# Patient Record
Sex: Female | Born: 1955 | Race: White | Hispanic: No | Marital: Single | Smoking: Former smoker
Health system: Southern US, Community
[De-identification: ages and names within clinical notes are randomized; demographics above are authoritative.]

## PROBLEM LIST (undated history)

## (undated) DIAGNOSIS — E119 Type 2 diabetes mellitus without complications: Secondary | ICD-10-CM

## (undated) DIAGNOSIS — K648 Other hemorrhoids: Secondary | ICD-10-CM

## (undated) DIAGNOSIS — I77810 Thoracic aortic ectasia: Secondary | ICD-10-CM

## (undated) DIAGNOSIS — K2270 Barrett's esophagus without dysplasia: Secondary | ICD-10-CM

## (undated) DIAGNOSIS — F32A Depression, unspecified: Secondary | ICD-10-CM

## (undated) DIAGNOSIS — G8929 Other chronic pain: Secondary | ICD-10-CM

## (undated) DIAGNOSIS — I2510 Atherosclerotic heart disease of native coronary artery without angina pectoris: Secondary | ICD-10-CM

## (undated) DIAGNOSIS — F419 Anxiety disorder, unspecified: Secondary | ICD-10-CM

## (undated) DIAGNOSIS — K219 Gastro-esophageal reflux disease without esophagitis: Secondary | ICD-10-CM

## (undated) DIAGNOSIS — M549 Dorsalgia, unspecified: Secondary | ICD-10-CM

## (undated) DIAGNOSIS — J45909 Unspecified asthma, uncomplicated: Secondary | ICD-10-CM

## (undated) DIAGNOSIS — F329 Major depressive disorder, single episode, unspecified: Secondary | ICD-10-CM

## (undated) DIAGNOSIS — G4730 Sleep apnea, unspecified: Secondary | ICD-10-CM

## (undated) DIAGNOSIS — I10 Essential (primary) hypertension: Secondary | ICD-10-CM

## (undated) DIAGNOSIS — M4800 Spinal stenosis, site unspecified: Secondary | ICD-10-CM

## (undated) DIAGNOSIS — G43909 Migraine, unspecified, not intractable, without status migrainosus: Secondary | ICD-10-CM

## (undated) HISTORY — DX: Sleep apnea, unspecified: G47.30

## (undated) HISTORY — PX: CARPAL TUNNEL RELEASE: SHX101

## (undated) HISTORY — DX: Barrett's esophagus without dysplasia: K22.70

## (undated) HISTORY — DX: Other chronic pain: G89.29

## (undated) HISTORY — DX: Atherosclerotic heart disease of native coronary artery without angina pectoris: I25.10

## (undated) HISTORY — DX: Dorsalgia, unspecified: M54.9

## (undated) HISTORY — DX: Gastro-esophageal reflux disease without esophagitis: K21.9

## (undated) HISTORY — DX: Thoracic aortic ectasia: I77.810

## (undated) HISTORY — DX: Migraine, unspecified, not intractable, without status migrainosus: G43.909

## (undated) HISTORY — DX: Type 2 diabetes mellitus without complications: E11.9

## (undated) HISTORY — DX: Anxiety disorder, unspecified: F41.9

## (undated) HISTORY — DX: Major depressive disorder, single episode, unspecified: F32.9

## (undated) HISTORY — DX: Essential (primary) hypertension: I10

## (undated) HISTORY — DX: Unspecified asthma, uncomplicated: J45.909

## (undated) HISTORY — DX: Depression, unspecified: F32.A

## (undated) HISTORY — PX: CHOLECYSTECTOMY: SHX55

## (undated) HISTORY — PX: ULNAR NERVE REPAIR: SHX2594

## (undated) HISTORY — DX: Spinal stenosis, site unspecified: M48.00

## (undated) HISTORY — DX: Other hemorrhoids: K64.8

---

## 2012-11-05 HISTORY — PX: DILATION AND CURETTAGE OF UTERUS: SHX78

## 2013-06-01 ENCOUNTER — Encounter: Payer: Self-pay | Admitting: *Deleted

## 2013-06-01 LAB — HM MAMMOGRAPHY

## 2013-06-17 ENCOUNTER — Ambulatory Visit (INDEPENDENT_AMBULATORY_CARE_PROVIDER_SITE_OTHER): Payer: Medicare Other | Admitting: Family Medicine

## 2013-06-17 ENCOUNTER — Encounter (INDEPENDENT_AMBULATORY_CARE_PROVIDER_SITE_OTHER): Payer: Self-pay

## 2013-06-17 ENCOUNTER — Encounter: Payer: Self-pay | Admitting: Family Medicine

## 2013-06-17 VITALS — BP 119/74 | HR 72 | Resp 16 | Ht 65.0 in | Wt 286.0 lb

## 2013-06-17 DIAGNOSIS — E559 Vitamin D deficiency, unspecified: Secondary | ICD-10-CM

## 2013-06-17 DIAGNOSIS — R5383 Other fatigue: Secondary | ICD-10-CM

## 2013-06-17 DIAGNOSIS — I10 Essential (primary) hypertension: Secondary | ICD-10-CM

## 2013-06-17 DIAGNOSIS — G43909 Migraine, unspecified, not intractable, without status migrainosus: Secondary | ICD-10-CM

## 2013-06-17 DIAGNOSIS — E785 Hyperlipidemia, unspecified: Secondary | ICD-10-CM

## 2013-06-17 DIAGNOSIS — M25562 Pain in left knee: Secondary | ICD-10-CM

## 2013-06-17 DIAGNOSIS — M25561 Pain in right knee: Secondary | ICD-10-CM

## 2013-06-17 DIAGNOSIS — M25569 Pain in unspecified knee: Secondary | ICD-10-CM

## 2013-06-17 DIAGNOSIS — R5381 Other malaise: Secondary | ICD-10-CM

## 2013-06-17 DIAGNOSIS — E119 Type 2 diabetes mellitus without complications: Secondary | ICD-10-CM

## 2013-06-17 DIAGNOSIS — M7989 Other specified soft tissue disorders: Secondary | ICD-10-CM

## 2013-06-17 DIAGNOSIS — F411 Generalized anxiety disorder: Secondary | ICD-10-CM

## 2013-06-17 DIAGNOSIS — K219 Gastro-esophageal reflux disease without esophagitis: Secondary | ICD-10-CM

## 2013-06-17 LAB — POCT GLYCOSYLATED HEMOGLOBIN (HGB A1C): Hemoglobin A1C: 6.3

## 2013-06-17 MED ORDER — DICLOFENAC SODIUM 1 % TD GEL: 4.0000 g | Freq: Four times a day (QID) | TRANSDERMAL | Status: AC

## 2013-06-17 MED ORDER — POTASSIUM CHLORIDE CRYS ER 20 MEQ PO TBCR: 20.0000 meq | EXTENDED_RELEASE_TABLET | Freq: Two times a day (BID) | ORAL | Status: AC

## 2013-06-17 MED ORDER — FUROSEMIDE 80 MG PO TABS: ORAL_TABLET | ORAL | Status: DC

## 2013-06-17 MED ORDER — ESOMEPRAZOLE MAGNESIUM 40 MG PO CPDR: 40.0000 mg | DELAYED_RELEASE_CAPSULE | Freq: Two times a day (BID) | ORAL | Status: DC

## 2013-06-17 MED ORDER — IRBESARTAN 150 MG PO TABS: 150.0000 mg | ORAL_TABLET | Freq: Every day | ORAL | Status: DC

## 2013-06-17 MED ORDER — METFORMIN HCL ER 500 MG PO TB24: 500.0000 mg | ORAL_TABLET | Freq: Two times a day (BID) | ORAL | Status: DC

## 2013-06-17 NOTE — Progress Notes (Signed)
Subjective:    Patient ID: Martha Nolan, female    DOB: 03/07/1955, 58 y.o.   MRN: [id number]  HPI  Martha Nolan is here today to re-establish care with our practice.  She needs medication refills for the following  conditions:    1) IFG  She is currently checking her sugars at home with a meter.  Her readings have ranged from 114 to 149.  Her A1c today is 6.3.   2) Hypertension - She is currently taking Atenolol and HCTZ.   She feels this combination is working well for her and would like refills.   3) Leg Swelling - She is currently taking Furosemide and Potassium Chloride.  She feels this is a good combination and is doing well.  She needs a refill.  4) Mood - She is currently taking Abilify and Cymbalta.  She feels its working and would like a refill.  5) Shortness of breath - She is currently using Proair HFA as needed.  She was tested for COPD.  She was told it was negative.    Review of Systems  Constitutional: Negative for activity change, appetite change and fatigue.  Respiratory: Positive for apnea, shortness of breath and wheezing.   Cardiovascular: Positive for leg swelling. Negative for chest pain and palpitations.  Psychiatric/Behavioral: Positive for sleep disturbance (sleep apnea). Negative for behavioral problems. The patient is not nervous/anxious.   All other systems reviewed and are negative.   Past Medical History  Diagnosis Date  . Migraine   . GERD (gastroesophageal reflux disease)   . Hypertension   . Internal hemorrhoids   . Diabetes mellitus without complication     not on any medications  . Anxiety   . Depression   . Barrett's esophagus     Dr. Nedra HaiLee (EGD - 03/2013) Q 3 years   . Asthma   . Chronic pain     Low Back L4-S1  . Sleep apnea      Past Surgical History  Procedure Laterality Date  . Cholecystectomy    . Cesarean section    . Carpal tunnel release    . Ulnar nerve repair       History   Social History Narrative   Marital Status:  She has a Media plannerDomestic Partner Tax inspector(Paige Green)   Children:  Daughter Vernona Rieger(Laura)    Pets: Dogs (2), Cats (2)     Living Situation:  Lives with domestic partner and children.   Occupation:  Disabled     Education:  [employer]    Tobacco Use/Exposure:  She quit smoking in 1/15.     Alcohol Use:  No   Drug Use: No   Diet: Healthy   Exercise: Limited    Hobbies:  Reading                 Family History  Problem Relation Age of Onset  . Hypertension Mother   . Hypertension Father   . Hypertension Maternal Grandmother   . Diabetes Maternal Grandmother   . Hypertension Maternal Grandfather   . Cervical cancer Paternal Grandmother   . Hypertension Paternal Grandmother   . Hypertension Paternal Grandfather   . Diabetes Paternal Uncle       Allergies  Allergen Reactions  . Penicillins Hives       Objective:   Physical Exam  Nursing note and vitals reviewed. Constitutional: She is oriented to person, place, and time. She appears well-developed.  HENT:  Head: Normocephalic.  Eyes: Pupils are equal,  round, and reactive to light.  Cardiovascular: Normal rate.   Pulmonary/Chest: Effort normal.  Neurological: She is alert and oriented to person, place, and time.  Skin: Skin is warm and dry.  Psychiatric: She has a normal mood and affect. Her behavior is normal. Judgment and thought content normal.       Assessment & Plan:    Martha Nolan was seen today for medication refills.  Diagnoses and associated orders for this visit:  Essential hypertension, benign - irbesartan (AVAPRO) 150 MG tablet; Take 1 tablet (150 mg total) by mouth daily. - COMPLETE METABOLIC PANEL WITH GFR  Migraine, unspecified, without mention of intractable migraine without mention of status migrainosus  Anxiety state, unspecified  Swelling of limb - potassium chloride SA (K-DUR,KLOR-CON) 20 MEQ tablet; Take 1 tablet (20 mEq total) by mouth 2 (two) times daily. - furosemide (LASIX) 80 MG tablet;  Take 1/2 - 1 tab daily for fluid  Type II or unspecified type diabetes mellitus without mention of complication, not stated as uncontrolled - POCT HgB A1C - metFORMIN (GLUCOPHAGE-XR) 500 MG 24 hr tablet; Take 1 tablet (500 mg total) by mouth 2 (two) times daily.  Knee pain, bilateral - diclofenac sodium (VOLTAREN) 1 % GEL; Apply 4 g topically 4 (four) times daily.  Other malaise and fatigue - CBC w/Diff - TSH  Other and unspecified hyperlipidemia - Lipid panel  Unspecified vitamin D deficiency - Vit D  25 hydroxy (rtn osteoporosis monitoring)  Gastroesophageal reflux disease without esophagitis - esomeprazole (NEXIUM) 40 MG capsule; Take 1 capsule (40 mg total) by mouth 2 (two) times daily before a meal.   TIME SPENT "FACE TO FACE" WITH PATIENT -  45 MINS

## 2013-06-19 LAB — CBC WITH DIFFERENTIAL/PLATELET
Basophils Absolute: 0.1 10*3/uL (ref 0.0–0.1)
Basophils Relative: 1 % (ref 0–1)
Eosinophils Absolute: 0.1 10*3/uL (ref 0.0–0.7)
Eosinophils Relative: 2 % (ref 0–5)
HCT: 35.8 % — ABNORMAL LOW (ref 36.0–46.0)
Hemoglobin: 12.4 g/dL (ref 12.0–15.0)
Lymphocytes Relative: 28 % (ref 12–46)
Lymphs Abs: 1.9 10*3/uL (ref 0.7–4.0)
MCH: 29.8 pg (ref 26.0–34.0)
MCHC: 34.6 g/dL (ref 30.0–36.0)
MCV: 86.1 fL (ref 78.0–100.0)
Monocytes Absolute: 0.4 10*3/uL (ref 0.1–1.0)
Monocytes Relative: 6 % (ref 3–12)
Neutro Abs: 4.2 10*3/uL (ref 1.7–7.7)
Neutrophils Relative %: 63 % (ref 43–77)
Platelets: 322 10*3/uL (ref 150–400)
RBC: 4.16 MIL/uL (ref 3.87–5.11)
RDW: 14.8 % (ref 11.5–15.5)
WBC: 6.7 10*3/uL (ref 4.0–10.5)

## 2013-06-19 LAB — COMPLETE METABOLIC PANEL WITH GFR
ALT: 23 U/L (ref 0–35)
AST: 28 U/L (ref 0–37)
Albumin: 4 g/dL (ref 3.5–5.2)
Alkaline Phosphatase: 67 U/L (ref 39–117)
BUN: 15 mg/dL (ref 6–23)
CO2: 30 mEq/L (ref 19–32)
Calcium: 9.7 mg/dL (ref 8.4–10.5)
Chloride: 95 mEq/L — ABNORMAL LOW (ref 96–112)
Creat: 1.01 mg/dL (ref 0.50–1.10)
GFR, Est African American: 72 mL/min
GFR, Est Non African American: 62 mL/min
Glucose, Bld: 113 mg/dL — ABNORMAL HIGH (ref 70–99)
Potassium: 3.5 mEq/L (ref 3.5–5.3)
Sodium: 138 mEq/L (ref 135–145)
Total Bilirubin: 0.7 mg/dL (ref 0.2–1.2)
Total Protein: 6.9 g/dL (ref 6.0–8.3)

## 2013-06-19 LAB — LIPID PANEL
Cholesterol: 186 mg/dL (ref 0–200)
HDL: 38 mg/dL — ABNORMAL LOW (ref >39)
LDL Cholesterol: 102 mg/dL — ABNORMAL HIGH (ref 0–99)
Total CHOL/HDL Ratio: 4.9 Ratio
Triglycerides: 229 mg/dL — ABNORMAL HIGH (ref <150)
VLDL: 46 mg/dL — ABNORMAL HIGH (ref 0–40)

## 2013-06-20 LAB — TSH: TSH: 0.919 u[IU]/mL (ref 0.350–4.500)

## 2013-06-20 LAB — VITAMIN D 25 HYDROXY (VIT D DEFICIENCY, FRACTURES): Vit D, 25-Hydroxy: 37 ng/mL (ref 30–89)

## 2013-07-26 DIAGNOSIS — M7989 Other specified soft tissue disorders: Secondary | ICD-10-CM | POA: Insufficient documentation

## 2013-07-26 DIAGNOSIS — M25562 Pain in left knee: Secondary | ICD-10-CM | POA: Insufficient documentation

## 2013-07-26 DIAGNOSIS — E119 Type 2 diabetes mellitus without complications: Secondary | ICD-10-CM | POA: Insufficient documentation

## 2013-07-26 DIAGNOSIS — E785 Hyperlipidemia, unspecified: Secondary | ICD-10-CM | POA: Insufficient documentation

## 2013-07-26 DIAGNOSIS — R5381 Other malaise: Secondary | ICD-10-CM | POA: Insufficient documentation

## 2013-07-26 DIAGNOSIS — E559 Vitamin D deficiency, unspecified: Secondary | ICD-10-CM | POA: Insufficient documentation

## 2013-07-26 DIAGNOSIS — F411 Generalized anxiety disorder: Secondary | ICD-10-CM | POA: Insufficient documentation

## 2013-07-26 DIAGNOSIS — R5383 Other fatigue: Secondary | ICD-10-CM

## 2013-07-26 DIAGNOSIS — R7301 Impaired fasting glucose: Secondary | ICD-10-CM | POA: Insufficient documentation

## 2013-07-26 DIAGNOSIS — M25561 Pain in right knee: Secondary | ICD-10-CM | POA: Insufficient documentation

## 2013-07-27 ENCOUNTER — Encounter: Payer: Self-pay | Admitting: Family Medicine

## 2013-07-27 ENCOUNTER — Ambulatory Visit (INDEPENDENT_AMBULATORY_CARE_PROVIDER_SITE_OTHER): Payer: Medicare Other | Admitting: Family Medicine

## 2013-07-27 VITALS — BP 128/79 | HR 84 | Resp 16 | Ht 65.5 in | Wt 236.0 lb

## 2013-07-27 DIAGNOSIS — I10 Essential (primary) hypertension: Secondary | ICD-10-CM

## 2013-07-27 DIAGNOSIS — Z0000 Encounter for general adult medical examination without abnormal findings: Secondary | ICD-10-CM

## 2013-07-27 DIAGNOSIS — M25561 Pain in right knee: Secondary | ICD-10-CM

## 2013-07-27 DIAGNOSIS — M25569 Pain in unspecified knee: Secondary | ICD-10-CM

## 2013-07-27 DIAGNOSIS — B351 Tinea unguium: Secondary | ICD-10-CM

## 2013-07-27 DIAGNOSIS — E119 Type 2 diabetes mellitus without complications: Secondary | ICD-10-CM

## 2013-07-27 DIAGNOSIS — Z23 Encounter for immunization: Secondary | ICD-10-CM

## 2013-07-27 LAB — POCT URINALYSIS DIPSTICK
Bilirubin, UA: NEGATIVE
Blood, UA: NEGATIVE
Glucose, UA: NEGATIVE
Ketones, UA: NEGATIVE
Leukocytes, UA: NEGATIVE
Nitrite, UA: NEGATIVE
Protein, UA: NEGATIVE
Spec Grav, UA: 1.015
Urobilinogen, UA: NEGATIVE
pH, UA: 7.5

## 2013-07-27 LAB — POCT UA - MICROALBUMIN
Albumin/Creatinine Ratio, Urine, POC: 4.2
Creatinine, POC: 120.3 mg/dL
Microalbumin Ur, POC: 5 mg/L

## 2013-07-27 MED ORDER — TERBINAFINE HCL 250 MG PO TABS: 250.0000 mg | ORAL_TABLET | Freq: Every day | ORAL | Status: AC

## 2013-07-27 MED ORDER — ZOSTER VACCINE LIVE 19400 UNT/0.65ML ~~LOC~~ SOLR: 0.6500 mL | Freq: Once | SUBCUTANEOUS | Status: DC

## 2013-07-27 MED ORDER — TETANUS-DIPHTH-ACELL PERTUSSIS 5-2.5-18.5 LF-MCG/0.5 IM SUSP: 0.5000 mL | Freq: Once | INTRAMUSCULAR | Status: DC

## 2013-07-27 NOTE — Patient Instructions (Signed)
1)  Preventative Issues -   A)  Confirm the date of your last colonoscopy, if you have had polyps in th past and when they want to do your next one (5 vs 10 years).   B) Confirm the date you had the Pneumovax  C)  Consider getting the Zostavax (after 08/26/2013) and the Tdap.    D)  Eye Exam   E)  Start on a Baby ASA 81 mg daily   2)  Nail Fungus - Take one Lamisil daily.  3)  Knee - We're referring you to an orthopedist.     Preventive Care for Adults A healthy lifestyle and preventive care can promote health and wellness. Preventive health guidelines for women include the following key practices.  A routine yearly physical is a good way to check with your health care provider about your health and preventive screening. It is a chance to share any concerns and updates on your health and to receive a thorough exam.  Visit your dentist for a routine exam and preventive care every 6 months. Brush your teeth twice a day and floss once a day. Good oral hygiene prevents tooth decay and gum disease.  The frequency of eye exams is based on your age, health, family medical history, use of contact lenses, and other factors. Follow your health care provider's recommendations for frequency of eye exams.  Eat a healthy diet. Foods like vegetables, fruits, whole grains, low-fat dairy products, and lean protein foods contain the nutrients you need without too many calories. Decrease your intake of foods high in solid fats, added sugars, and salt. Eat the right amount of calories for you.Get information about a proper diet from your health care provider, if necessary.  Regular physical exercise is one of the most important things you can do for your health. Most adults should get at least 150 minutes of moderate-intensity exercise (any activity that increases your heart rate and causes you to sweat) each week. In addition, most adults need muscle-strengthening exercises on 2 or more days a  week.  Maintain a healthy weight. The body mass index (BMI) is a screening tool to identify possible weight problems. It provides an estimate of body fat based on height and weight. Your health care provider can find your BMI, and can help you achieve or maintain a healthy weight.For adults 20 years and older:  A BMI below 18.5 is considered underweight.  A BMI of 18.5 to 24.9 is normal.  A BMI of 25 to 29.9 is considered overweight.  A BMI of 30 and above is considered obese.  Maintain normal blood lipids and cholesterol levels by exercising and minimizing your intake of saturated fat. Eat a balanced diet with plenty of fruit and vegetables. Blood tests for lipids and cholesterol should begin at age 68 and be repeated every 5 years. If your lipid or cholesterol levels are high, you are over 50, or you are at high risk for heart disease, you may need your cholesterol levels checked more frequently.Ongoing high lipid and cholesterol levels should be treated with medicines if diet and exercise are not working.  If you smoke, find out from your health care provider how to quit. If you do not use tobacco, do not start.  Lung cancer screening is recommended for adults aged 49-80 years who are at high risk for developing lung cancer because of a history of smoking. A yearly low-dose CT scan of the lungs is recommended for people who have at  least a 30-pack-year history of smoking and are a current smoker or have quit within the past 15 years. A pack year of smoking is smoking an average of 1 pack of cigarettes a day for 1 year (for example: 1 pack a day for 30 years or 2 packs a day for 15 years). Yearly screening should continue until the smoker has stopped smoking for at least 15 years. Yearly screening should be stopped for people who develop a health problem that would prevent them from having lung cancer treatment.  If you are pregnant, do not drink alcohol. If you are breastfeeding, be very  cautious about drinking alcohol. If you are not pregnant and choose to drink alcohol, do not have more than 1 drink per day. One drink is considered to be 12 ounces (355 mL) of beer, 5 ounces (148 mL) of wine, or 1.5 ounces (44 mL) of liquor.  Avoid use of street drugs. Do not share needles with anyone. Ask for help if you need support or instructions about stopping the use of drugs.  High blood pressure causes heart disease and increases the risk of stroke. Your blood pressure should be checked at least every 1 to 2 years. Ongoing high blood pressure should be treated with medicines if weight loss and exercise do not work.  If you are 77-47 years old, ask your health care provider if you should take aspirin to prevent strokes.  Diabetes screening involves taking a blood sample to check your fasting blood sugar level. This should be done once every 3 years, after age 64, if you are within normal weight and without risk factors for diabetes. Testing should be considered at a younger age or be carried out more frequently if you are overweight and have at least 1 risk factor for diabetes.  Breast cancer screening is essential preventive care for women. You should practice "breast self-awareness." This means understanding the normal appearance and feel of your breasts and may include breast self-examination. Any changes detected, no matter how small, should be reported to a health care provider. Women in their 104s and 30s should have a clinical breast exam (CBE) by a health care provider as part of a regular health exam every 1 to 3 years. After age 42, women should have a CBE every year. Starting at age 60, women should consider having a mammogram (breast X-ray test) every year. Women who have a family history of breast cancer should talk to their health care provider about genetic screening. Women at a high risk of breast cancer should talk to their health care providers about having an MRI and a mammogram  every year.  Breast cancer gene (BRCA)-related cancer risk assessment is recommended for women who have family members with BRCA-related cancers. BRCA-related cancers include breast, ovarian, tubal, and peritoneal cancers. Having family members with these cancers may be associated with an increased risk for harmful changes (mutations) in the breast cancer genes BRCA1 and BRCA2. Results of the assessment will determine the need for genetic counseling and BRCA1 and BRCA2 testing.  Routine pelvic exams to screen for cancer are no longer recommended for nonpregnant women who are considered low risk for cancer of the pelvic organs (ovaries, uterus, and vagina) and who do not have symptoms. Ask your health care provider if a screening pelvic exam is right for you.  If you have had past treatment for cervical cancer or a condition that could lead to cancer, you need Pap tests and screening for cancer  for at least 20 years after your treatment. If Pap tests have been discontinued, your risk factors (such as having a new sexual partner) need to be reassessed to determine if screening should be resumed. Some women have medical problems that increase the chance of getting cervical cancer. In these cases, your health care provider may recommend more frequent screening and Pap tests.  The HPV test is an additional test that may be used for cervical cancer screening. The HPV test looks for the virus that can cause the cell changes on the cervix. The cells collected during the Pap test can be tested for HPV. The HPV test could be used to screen women aged 80 years and older, and should be used in women of any age who have unclear Pap test results. After the age of 51, women should have HPV testing at the same frequency as a Pap test.  Colorectal cancer can be detected and often prevented. Most routine colorectal cancer screening begins at the age of 48 years and continues through age 77 years. However, your health care  provider may recommend screening at an earlier age if you have risk factors for colon cancer. On a yearly basis, your health care provider may provide home test kits to check for hidden blood in the stool. Use of a small camera at the end of a tube, to directly examine the colon (sigmoidoscopy or colonoscopy), can detect the earliest forms of colorectal cancer. Talk to your health care provider about this at age 70, when routine screening begins. Direct exam of the colon should be repeated every 5-10 years through age 30 years, unless early forms of pre-cancerous polyps or small growths are found.  People who are at an increased risk for hepatitis B should be screened for this virus. You are considered at high risk for hepatitis B if:  You were born in a country where hepatitis B occurs often. Talk with your health care provider about which countries are considered high risk.  Your parents were born in a high-risk country and you have not received a shot to protect against hepatitis B (hepatitis B vaccine).  You have HIV or AIDS.  You use needles to inject street drugs.  You live with, or have sex with, someone who has Hepatitis B.  You get hemodialysis treatment.  You take certain medicines for conditions like cancer, organ transplantation, and autoimmune conditions.  Hepatitis C blood testing is recommended for all people born from [birth year] through 1965 and any individual with known risks for hepatitis C.  Practice safe sex. Use condoms and avoid high-risk sexual practices to reduce the spread of sexually transmitted infections (STIs). STIs include gonorrhea, chlamydia, syphilis, trichomonas, herpes, HPV, and human immunodeficiency virus (HIV). Herpes, HIV, and HPV are viral illnesses that have no cure. They can result in disability, cancer, and death.  You should be screened for sexually transmitted illnesses (STIs) including gonorrhea and chlamydia if:  You are sexually active and are  younger than 24 years.  You are older than 24 years and your health care provider tells you that you are at risk for this type of infection.  Your sexual activity has changed since you were last screened and you are at an increased risk for chlamydia or gonorrhea. Ask your health care provider if you are at risk.  If you are at risk of being infected with HIV, it is recommended that you take a prescription medicine daily to prevent HIV infection. This is called  preexposure prophylaxis (PrEP). You are considered at risk if:  You are a heterosexual woman, are sexually active, and are at increased risk for HIV infection.  You take drugs by injection.  You are sexually active with a partner who has HIV.  Talk with your health care provider about whether you are at high risk of being infected with HIV. If you choose to begin PrEP, you should first be tested for HIV. You should then be tested every 3 months for as long as you are taking PrEP.  Osteoporosis is a disease in which the bones lose minerals and strength with aging. This can result in serious bone fractures or breaks. The risk of osteoporosis can be identified using a bone density scan. Women ages 44 years and over and women at risk for fractures or osteoporosis should discuss screening with their health care providers. Ask your health care provider whether you should take a calcium supplement or vitamin D to reduce the rate of osteoporosis.  Menopause can be associated with physical symptoms and risks. Hormone replacement therapy is available to decrease symptoms and risks. You should talk to your health care provider about whether hormone replacement therapy is right for you.  Use sunscreen. Apply sunscreen liberally and repeatedly throughout the day. You should seek shade when your shadow is shorter than you. Protect yourself by wearing long sleeves, pants, a wide-brimmed hat, and sunglasses year round, whenever you are outdoors.  Once a  month, do a whole body skin exam, using a mirror to look at the skin on your back. Tell your health care provider of new moles, moles that have irregular borders, moles that are larger than a pencil eraser, or moles that have changed in shape or color.  Stay current with required vaccines (immunizations).  Influenza vaccine. All adults should be immunized every year.  Tetanus, diphtheria, and acellular pertussis (Td, Tdap) vaccine. Pregnant women should receive 1 dose of Tdap vaccine during each pregnancy. The dose should be obtained regardless of the length of time since the last dose. Immunization is preferred during the 27th-36th week of gestation. An adult who has not previously received Tdap or who does not know her vaccine status should receive 1 dose of Tdap. This initial dose should be followed by tetanus and diphtheria toxoids (Td) booster doses every 10 years. Adults with an unknown or incomplete history of completing a 3-dose immunization series with Td-containing vaccines should begin or complete a primary immunization series including a Tdap dose. Adults should receive a Td booster every 10 years.  Varicella vaccine. An adult without evidence of immunity to varicella should receive 2 doses or a second dose if she has previously received 1 dose. Pregnant females who do not have evidence of immunity should receive the first dose after pregnancy. This first dose should be obtained before leaving the health care facility. The second dose should be obtained 4-8 weeks after the first dose.  Human papillomavirus (HPV) vaccine. Females aged 13-26 years who have not received the vaccine previously should obtain the 3-dose series. The vaccine is not recommended for use in pregnant females. However, pregnancy testing is not needed before receiving a dose. If a female is found to be pregnant after receiving a dose, no treatment is needed. In that case, the remaining doses should be delayed until after the  pregnancy. Immunization is recommended for any person with an immunocompromised condition through the age of 67 years if she did not get any or all doses earlier.  During the 3-dose series, the second dose should be obtained 4-8 weeks after the first dose. The third dose should be obtained 24 weeks after the first dose and 16 weeks after the second dose.  Zoster vaccine. One dose is recommended for adults aged 25 years or older unless certain conditions are present.  Measles, mumps, and rubella (MMR) vaccine. Adults born before 26 generally are considered immune to measles and mumps. Adults born in [birth year] or later should have 1 or more doses of MMR vaccine unless there is a contraindication to the vaccine or there is laboratory evidence of immunity to each of the three diseases. A routine second dose of MMR vaccine should be obtained at least 28 days after the first dose for students attending postsecondary schools, health care workers, or international travelers. People who received inactivated measles vaccine or an unknown type of measles vaccine during 1963-1967 should receive 2 doses of MMR vaccine. People who received inactivated mumps vaccine or an unknown type of mumps vaccine before 1979 and are at high risk for mumps infection should consider immunization with 2 doses of MMR vaccine. For females of childbearing age, rubella immunity should be determined. If there is no evidence of immunity, females who are not pregnant should be vaccinated. If there is no evidence of immunity, females who are pregnant should delay immunization until after pregnancy. Unvaccinated health care workers born before 50 who lack laboratory evidence of measles, mumps, or rubella immunity or laboratory confirmation of disease should consider measles and mumps immunization with 2 doses of MMR vaccine or rubella immunization with 1 dose of MMR vaccine.  Pneumococcal 13-valent conjugate (PCV13) vaccine. When indicated, a person  who is uncertain of her immunization history and has no record of immunization should receive the PCV13 vaccine. An adult aged 37 years or older who has certain medical conditions and has not been previously immunized should receive 1 dose of PCV13 vaccine. This PCV13 should be followed with a dose of pneumococcal polysaccharide (PPSV23) vaccine. The PPSV23 vaccine dose should be obtained at least 8 weeks after the dose of PCV13 vaccine. An adult aged 22 years or older who has certain medical conditions and previously received 1 or more doses of PPSV23 vaccine should receive 1 dose of PCV13. The PCV13 vaccine dose should be obtained 1 or more years after the last PPSV23 vaccine dose.  Pneumococcal polysaccharide (PPSV23) vaccine. When PCV13 is also indicated, PCV13 should be obtained first. All adults aged 51 years and older should be immunized. An adult younger than age 76 years who has certain medical conditions should be immunized. Any person who resides in a nursing home or long-term care facility should be immunized. An adult smoker should be immunized. People with an immunocompromised condition and certain other conditions should receive both PCV13 and PPSV23 vaccines. People with human immunodeficiency virus (HIV) infection should be immunized as soon as possible after diagnosis. Immunization during chemotherapy or radiation therapy should be avoided. Routine use of PPSV23 vaccine is not recommended for American Indians, [place] Natives, or people younger than 65 years unless there are medical conditions that require PPSV23 vaccine. When indicated, people who have unknown immunization and have no record of immunization should receive PPSV23 vaccine. One-time revaccination 5 years after the first dose of PPSV23 is recommended for people aged 19-64 years who have chronic kidney failure, nephrotic syndrome, asplenia, or immunocompromised conditions. People who received 1-2 doses of PPSV23 before age 58 years  should receive another dose of PPSV23 vaccine  at age 37 years or later if at least 5 years have passed since the previous dose. Doses of PPSV23 are not needed for people immunized with PPSV23 at or after age 76 years.  Meningococcal vaccine. Adults with asplenia or persistent complement component deficiencies should receive 2 doses of quadrivalent meningococcal conjugate (MenACWY-D) vaccine. The doses should be obtained at least 2 months apart. Microbiologists working with certain meningococcal bacteria, [employer] recruits, people at risk during an outbreak, and people who travel to or live in countries with a high rate of meningitis should be immunized. A first-year college student up through age 77 years who is living in a residence hall should receive a dose if she did not receive a dose on or after her 16th birthday. Adults who have certain high-risk conditions should receive one or more doses of vaccine.  Hepatitis A vaccine. Adults who wish to be protected from this disease, have certain high-risk conditions, work with hepatitis A-infected animals, work in hepatitis A research labs, or travel to or work in countries with a high rate of hepatitis A should be immunized. Adults who were previously unvaccinated and who anticipate close contact with an international adoptee during the first 60 days after arrival in the [place] from a country with a high rate of hepatitis A should be immunized.  Hepatitis B vaccine. Adults who wish to be protected from this disease, have certain high-risk conditions, may be exposed to blood or other infectious body fluids, are household contacts or sex partners of hepatitis B positive people, are clients or workers in certain care facilities, or travel to or work in countries with a high rate of hepatitis B should be immunized.  Haemophilus influenzae type b (Hib) vaccine. A previously unvaccinated person with asplenia or sickle cell disease or having a scheduled  splenectomy should receive 1 dose of Hib vaccine. Regardless of previous immunization, a recipient of a hematopoietic stem cell transplant should receive a 3-dose series 6-12 months after her successful transplant. Hib vaccine is not recommended for adults with HIV infection. Preventive Services / Frequency Ages 28 to 39years  Blood pressure check.** / Every 1 to 2 years.  Lipid and cholesterol check.** / Every 5 years beginning at age 68.  Clinical breast exam.** / Every 3 years for women in their 52s and 74s.  BRCA-related cancer risk assessment.** / For women who have family members with a BRCA-related cancer (breast, ovarian, tubal, or peritoneal cancers).  Pap test.** / Every 2 years from ages 58 through 22. Every 3 years starting at age 75 through age 66 or 37 with a history of 3 consecutive normal Pap tests.  HPV screening.** / Every 3 years from ages 34 through ages 74 to 32 with a history of 3 consecutive normal Pap tests.  Hepatitis C blood test.** / For any individual with known risks for hepatitis C.  Skin self-exam. / Monthly.  Influenza vaccine. / Every year.  Tetanus, diphtheria, and acellular pertussis (Tdap, Td) vaccine.** / Consult your health care provider. Pregnant women should receive 1 dose of Tdap vaccine during each pregnancy. 1 dose of Td every 10 years.  Varicella vaccine.** / Consult your health care provider. Pregnant females who do not have evidence of immunity should receive the first dose after pregnancy.  HPV vaccine. / 3 doses over 6 months, if 18 and younger. The vaccine is not recommended for use in pregnant females. However, pregnancy testing is not needed before receiving a dose.  Measles, mumps, rubella (  MMR) vaccine.** / You need at least 1 dose of MMR if you were born in 1957 or later. You may also need a 2nd dose. For females of childbearing age, rubella immunity should be determined. If there is no evidence of immunity, females who are not  pregnant should be vaccinated. If there is no evidence of immunity, females who are pregnant should delay immunization until after pregnancy.  Pneumococcal 13-valent conjugate (PCV13) vaccine.** / Consult your health care provider.  Pneumococcal polysaccharide (PPSV23) vaccine.** / 1 to 2 doses if you smoke cigarettes or if you have certain conditions.  Meningococcal vaccine.** / 1 dose if you are age 29 to 79 years and a Market researcher living in a residence hall, or have one of several medical conditions, you need to get vaccinated against meningococcal disease. You may also need additional booster doses.  Hepatitis A vaccine.** / Consult your health care provider.  Hepatitis B vaccine.** / Consult your health care provider.  Haemophilus influenzae type b (Hib) vaccine.** / Consult your health care provider. Ages 66 to 64years  Blood pressure check.** / Every 1 to 2 years.  Lipid and cholesterol check.** / Every 5 years beginning at age 59 years.  Lung cancer screening. / Every year if you are aged 28-80 years and have a 30-pack-year history of smoking and currently smoke or have quit within the past 15 years. Yearly screening is stopped once you have quit smoking for at least 15 years or develop a health problem that would prevent you from having lung cancer treatment.  Clinical breast exam.** / Every year after age 66 years.  BRCA-related cancer risk assessment.** / For women who have family members with a BRCA-related cancer (breast, ovarian, tubal, or peritoneal cancers).  Mammogram.** / Every year beginning at age 14 years and continuing for as long as you are in good health. Consult with your health care provider.  Pap test.** / Every 3 years starting at age 20 years through age 65 or 8 years with a history of 3 consecutive normal Pap tests.  HPV screening.** / Every 3 years from ages 68 years through ages 52 to 14 years with a history of 3 consecutive normal Pap  tests.  Fecal occult blood test (FOBT) of stool. / Every year beginning at age 28 years and continuing until age 42 years. You may not need to do this test if you get a colonoscopy every 10 years.  Flexible sigmoidoscopy or colonoscopy.** / Every 5 years for a flexible sigmoidoscopy or every 10 years for a colonoscopy beginning at age 22 years and continuing until age 31 years.  Hepatitis C blood test.** / For all people born from [birth year] through 1963 and any individual with known risks for hepatitis C.  Skin self-exam. / Monthly.  Influenza vaccine. / Every year.  Tetanus, diphtheria, and acellular pertussis (Tdap/Td) vaccine.** / Consult your health care provider. Pregnant women should receive 1 dose of Tdap vaccine during each pregnancy. 1 dose of Td every 10 years.  Varicella vaccine.** / Consult your health care provider. Pregnant females who do not have evidence of immunity should receive the first dose after pregnancy.  Zoster vaccine.** / 1 dose for adults aged 85 years or older.  Measles, mumps, rubella (MMR) vaccine.** / You need at least 1 dose of MMR if you were born in 1957 or later. You may also need a 2nd dose. For females of childbearing age, rubella immunity should be determined. If there is no evidence  of immunity, females who are not pregnant should be vaccinated. If there is no evidence of immunity, females who are pregnant should delay immunization until after pregnancy.  Pneumococcal 13-valent conjugate (PCV13) vaccine.** / Consult your health care provider.  Pneumococcal polysaccharide (PPSV23) vaccine.** / 1 to 2 doses if you smoke cigarettes or if you have certain conditions.  Meningococcal vaccine.** / Consult your health care provider.  Hepatitis A vaccine.** / Consult your health care provider.  Hepatitis B vaccine.** / Consult your health care provider.  Haemophilus influenzae type b (Hib) vaccine.** / Consult your health care provider. Ages 35 years and  over  Blood pressure check.** / Every 1 to 2 years.  Lipid and cholesterol check.** / Every 5 years beginning at age 75 years.  Lung cancer screening. / Every year if you are aged 44-80 years and have a 30-pack-year history of smoking and currently smoke or have quit within the past 15 years. Yearly screening is stopped once you have quit smoking for at least 15 years or develop a health problem that would prevent you from having lung cancer treatment.  Clinical breast exam.** / Every year after age 31 years.  BRCA-related cancer risk assessment.** / For women who have family members with a BRCA-related cancer (breast, ovarian, tubal, or peritoneal cancers).  Mammogram.** / Every year beginning at age 43 years and continuing for as long as you are in good health. Consult with your health care provider.  Pap test.** / Every 3 years starting at age 62 years through age 51 or 58 years with 3 consecutive normal Pap tests. Testing can be stopped between 65 and 70 years with 3 consecutive normal Pap tests and no abnormal Pap or HPV tests in the past 10 years.  HPV screening.** / Every 3 years from ages 62 years through ages 46 or 24 years with a history of 3 consecutive normal Pap tests. Testing can be stopped between 65 and 70 years with 3 consecutive normal Pap tests and no abnormal Pap or HPV tests in the past 10 years.  Fecal occult blood test (FOBT) of stool. / Every year beginning at age 77 years and continuing until age 86 years. You may not need to do this test if you get a colonoscopy every 10 years.  Flexible sigmoidoscopy or colonoscopy.** / Every 5 years for a flexible sigmoidoscopy or every 10 years for a colonoscopy beginning at age 3 years and continuing until age 22 years.  Hepatitis C blood test.** / For all people born from [birth year] through 1963 and any individual with known risks for hepatitis C.  Osteoporosis screening.** / A one-time screening for women ages 64 years and over and  women at risk for fractures or osteoporosis.  Skin self-exam. / Monthly.  Influenza vaccine. / Every year.  Tetanus, diphtheria, and acellular pertussis (Tdap/Td) vaccine.** / 1 dose of Td every 10 years.  Varicella vaccine.** / Consult your health care provider.  Zoster vaccine.** / 1 dose for adults aged 30 years or older.  Pneumococcal 13-valent conjugate (PCV13) vaccine.** / Consult your health care provider.  Pneumococcal polysaccharide (PPSV23) vaccine.** / 1 dose for all adults aged 4 years and older.  Meningococcal vaccine.** / Consult your health care provider.  Hepatitis A vaccine.** / Consult your health care provider.  Hepatitis B vaccine.** / Consult your health care provider.  Haemophilus influenzae type b (Hib) vaccine.** / Consult your health care provider. ** Family history and personal history of risk and conditions may change  your health care provider's recommendations. Document Released: 03/20/2001 Document Revised: 01/27/2013 Document Reviewed: 06/19/2010 [facility] Patient Information 2015 [place], [place]. This information is not intended to replace advice given to you by your health care provider. Make sure you discuss any questions you have with your health care provider.

## 2013-07-27 NOTE — Progress Notes (Signed)
   Subjective:    Patient ID: Martha Nolan, female    DOB: 09/10/1955, 58 y.o.   MRN: [id number]  HPI Martha Nolan is here today for her annual CPE. She sees an GYN for her PAPs. She is needing complaining of right knee pain. She says the Voltaren Gel helps for a short time.    Review of Systems  Constitutional: Negative for activity change, appetite change and fatigue.  Cardiovascular: Positive for leg swelling. Negative for chest pain and palpitations.  Musculoskeletal:       Right knee pain  Neurological: Negative for light-headedness and headaches.  Psychiatric/Behavioral: Negative for behavioral problems, sleep disturbance and agitation. The patient is not nervous/anxious.   All other systems reviewed and are negative.      Objective:   Physical Exam  Vitals reviewed. Constitutional: She is oriented to person, place, and time. She appears well-developed and well-nourished.  HENT:  Head: Normocephalic and atraumatic.  Right Ear: External ear normal.  Left Ear: External ear normal.  Nose: Nose normal.  Eyes: Conjunctivae and EOM are normal. Pupils are equal, round, and reactive to light.  Neck: Normal range of motion. No thyromegaly present.  Cardiovascular: Normal rate.   Pulmonary/Chest: Effort normal and breath sounds normal. Right breast exhibits no inverted nipple, no mass, no nipple discharge, no skin change and no tenderness. Left breast exhibits no inverted nipple, no mass, no nipple discharge, no skin change and no tenderness. Breasts are symmetrical.  Abdominal: Soft. Bowel sounds are normal. Hernia confirmed negative in the right inguinal area and confirmed negative in the left inguinal area.  Genitourinary: Vagina normal and uterus normal. Pelvic exam was performed with patient supine. There is no rash, tenderness or lesion on the right labia. There is no rash, tenderness or lesion on the left labia. No vaginal discharge found.  Musculoskeletal: She exhibits edema and  tenderness.  Lymphadenopathy:    She has no cervical adenopathy.       Right: No inguinal adenopathy present.       Left: No inguinal adenopathy present.  Neurological: She is alert and oriented to person, place, and time.  Skin: Skin is warm and dry.  Psychiatric: She has a normal mood and affect. Her behavior is normal. Judgment and thought content normal.       Assessment & Plan:    Martha Nolan was seen today for annual exam.  Diagnoses and associated orders for this visit:  Routine general medical examination at a health care facility - POCT urinalysis dipstick  Need for prophylactic vaccination against Streptococcus pneumoniae (pneumococcus) - Pneumococcal conjugate vaccine 13-valent  Need for Zostavax administration - zoster vaccine live, PF, (ZOSTAVAX) [id number] UNT/0.65ML injection; Inject 19,400 Units into the skin once.  Need for Tdap vaccination - Tdap (BOOSTRIX) 5-2.5-18.5 LF-MCG/0.5 injection; Inject 0.5 mLs into the muscle once.  Type II or unspecified type diabetes mellitus without mention of complication, not stated as uncontrolled - POCT UA - Microalbumin  Right knee pain  Essential hypertension, benign - POCT UA - Microalbumin  Toenail fungus - terbinafine (LAMISIL) 250 MG tablet; Take 1 tablet (250 mg total) by mouth daily.   TIME SPENT "FACE TO FACE" WITH PATIENT -  40 MINS

## 2013-08-05 ENCOUNTER — Encounter (INDEPENDENT_AMBULATORY_CARE_PROVIDER_SITE_OTHER): Payer: Self-pay

## 2013-08-05 ENCOUNTER — Ambulatory Visit (INDEPENDENT_AMBULATORY_CARE_PROVIDER_SITE_OTHER): Payer: Medicare Other | Admitting: Family Medicine

## 2013-08-05 ENCOUNTER — Encounter: Payer: Self-pay | Admitting: Family Medicine

## 2013-08-05 VITALS — BP 112/73 | HR 98 | Ht 65.0 in | Wt 280.0 lb

## 2013-08-05 DIAGNOSIS — M25561 Pain in right knee: Secondary | ICD-10-CM

## 2013-08-05 DIAGNOSIS — M25569 Pain in unspecified knee: Secondary | ICD-10-CM

## 2013-08-05 MED ORDER — METHYLPREDNISOLONE ACETATE 40 MG/ML IJ SUSP
40.0000 mg | Freq: Once | INTRAMUSCULAR | Status: AC
  Administered 2013-08-05: 40 mg via INTRA_ARTICULAR

## 2013-08-05 NOTE — Patient Instructions (Signed)
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 Take tylenol 500mg  1-2 tabs three times a day for pain. Voltaren gel 4 times a day for pain and inflammation. Glucosamine sulfate 750mg  twice a day is a supplement that may help. Capsaicin topically up to four times a day may also help with pain. Cortisone injections are an option - you were given one of these today. If cortisone injections do not help, there are different types of shots that may help but they take longer to take effect. It's important that you continue to stay active. Straight leg raises, knee extensions 3 sets of 10 once a day (add ankle weight if these become too easy). Consider physical therapy to strengthen muscles around the joint that hurts to take pressure off of the joint itself. Shoe inserts with good arch support may be helpful. Continue using cane as needed. Heat or ice 15 minutes at a time 3-4 times a day as needed to help with pain. Follow up with me in 1 month.

## 2013-08-10 ENCOUNTER — Encounter: Payer: Self-pay | Admitting: Family Medicine

## 2013-08-10 DIAGNOSIS — M25561 Pain in right knee: Secondary | ICD-10-CM | POA: Insufficient documentation

## 2013-08-10 NOTE — Progress Notes (Signed)
Patient ID: Martha Nolan, Martha Nolan   DOB: 05/19/1955, 58 y.o.   MRN: [id number]  PCP: No primary provider on file.  Subjective:   HPI: Patient is a 58 y.o. Martha Nolan here for right knee pain.  Patient denies known injury. Patient reports one morning 2 months ago pain just started hurting medially. No swelling. Tried voltaren gel.  Takes oxycodone for her back. Pain worse with prolonged sitting. Some buckling. Usually using a cane. No catching or locking. Has not had radiographs.  Past Medical History  Diagnosis Date  . Diabetes mellitus without complication   . Hypertension   . Chronic back pain   . Spinal stenosis     No current outpatient prescriptions on file prior to visit.   No current facility-administered medications on file prior to visit.    Past Surgical History  Procedure Laterality Date  . Cesarean section    . Cholecystectomy      Allergies  Allergen Reactions  . Penicillins     History   Social History  . Marital Status: Single    Spouse Name: N/A    Number of Children: N/A  . Years of Education: N/A   Occupational History  . Not on file.   Social History Main Topics  . Smoking status: Former Games developermoker  . Smokeless tobacco: Not on file  . Alcohol Use: Not on file  . Drug Use: Not on file  . Sexual Activity: Not on file   Other Topics Concern  . Not on file   Social History Narrative  . No narrative on file    No family history on file.  BP 112/73  Pulse 98  Ht 5\' 5"  (1.651 m)  Wt 280 lb (127.007 kg)  BMI 46.59 kg/m2  Review of Systems: See HPI above.    Objective:  Physical Exam:  Gen: NAD  Right knee: No gross deformity, ecchymoses, swelling. TTP medial joint line.  No other tenderness. FROM. Negative ant/post drawers. Negative valgus/varus testing. Negative lachmanns. Negative mcmurrays, apleys, patellar apprehension. NV intact distally.    Assessment & Plan:  1. Right knee pain - 2/2 DJD, less likely degenerative  meniscus tear.  Discussed tylenol, glucosamine, capsaicin.  Continue voltaren gel.  Given intraarticular injection today.  HEP reviewed.  Consider PT, arch support.  F/u in 1 month.  After informed written consent, patient was seated on exam table. Right knee was prepped with alcohol swab and utilizing anterolateral approach, patient's right knee was injected intraarticularly with 3:1 marcaine: depomedrol. Patient tolerated the procedure well without immediate complications.

## 2013-08-10 NOTE — Assessment & Plan Note (Signed)
2/2 DJD, less likely degenerative meniscus tear.  Discussed tylenol, glucosamine, capsaicin.  Continue voltaren gel.  Given intraarticular injection today.  HEP reviewed.  Consider PT, arch support.  F/u in 1 month.  After informed written consent, patient was seated on exam table. Right knee was prepped with alcohol swab and utilizing anterolateral approach, patient's right knee was injected intraarticularly with 3:1 marcaine: depomedrol. Patient tolerated the procedure well without immediate complications.

## 2013-08-17 ENCOUNTER — Encounter: Payer: Self-pay | Admitting: *Deleted

## 2013-09-03 ENCOUNTER — Ambulatory Visit (INDEPENDENT_AMBULATORY_CARE_PROVIDER_SITE_OTHER): Payer: Medicare Other | Admitting: Family Medicine

## 2013-09-03 ENCOUNTER — Encounter: Payer: Self-pay | Admitting: Family Medicine

## 2013-09-03 VITALS — BP 103/65 | HR 106 | Ht 65.0 in | Wt 280.0 lb

## 2013-09-03 DIAGNOSIS — M25569 Pain in unspecified knee: Secondary | ICD-10-CM

## 2013-09-03 DIAGNOSIS — M7660 Achilles tendinitis, unspecified leg: Secondary | ICD-10-CM

## 2013-09-03 DIAGNOSIS — M25561 Pain in right knee: Secondary | ICD-10-CM

## 2013-09-03 DIAGNOSIS — M7661 Achilles tendinitis, right leg: Secondary | ICD-10-CM

## 2013-09-03 MED ORDER — NITROGLYCERIN 0.2 MG/HR TD PT24: MEDICATED_PATCH | TRANSDERMAL | Status: DC

## 2013-09-03 NOTE — Patient Instructions (Signed)
Let me know if you want to do physical therapy or imaging for your knee.  For the achilles use heel lifts regularly, try to avoid barefoot walking and flat shoes. Continue home exercises they showed you in therapy. Use nitro patches - 1/4th patch over affected achilles, change daily. Follow up with me in 6 weeks.

## 2013-09-07 ENCOUNTER — Encounter: Payer: Self-pay | Admitting: Family Medicine

## 2013-09-07 DIAGNOSIS — M7661 Achilles tendinitis, right leg: Secondary | ICD-10-CM | POA: Insufficient documentation

## 2013-09-07 NOTE — Assessment & Plan Note (Signed)
discussed home exercises to continue doing (done PT already).  Start nitro patches as she hasn't done this before.  Heel lifts, avoid barefoot walking and flat shoes.  F/u in 6 weeks.  Discussed risks of headaches, skin irritation with nitro patches.

## 2013-09-07 NOTE — Assessment & Plan Note (Signed)
2/2 DJD, less likely degenerative meniscus tear.  She would like to continue with conservative measures - discussed tylenol, glucosamine, capsaicin.  Continue voltaren gel.  S/p intraarticular injection today.  HEP.  Consider PT, arch support.  F/u in 6 weeks or as needed for this issue.

## 2013-09-07 NOTE — Progress Notes (Addendum)
Patient ID: Martha Nolan, female   DOB: [birth year], 58 y.o.   MRN: [id number]  PCP: No primary provider on file.  Subjective:   HPI: Patient is a 58 y.o. female here for right knee pain.  7/1: Patient denies known injury. Patient reports one morning 2 months ago pain just started hurting medially. No swelling. Tried voltaren gel.  Takes oxycodone for her back. Pain worse with prolonged sitting. Some buckling. Usually using a cane. No catching or locking. Has not had radiographs.  7/30: Patient reports her right knee has improved. Had a single flare about 1 1/2 weeks ago but this has gotten better. Used voltaren gel.  Felt injection also provided benefit. No catching, locking. Pain worse when she gets up from squatting. Using cane for support. Major issue is more right achiles tendinopathy she's dealt with for over a year. Has tried exercises, different shoes, orthotics. Recently recommended she consider surgery.  Past Medical History  Diagnosis Date  . Diabetes mellitus without complication   . Hypertension   . Chronic back pain   . Spinal stenosis     Current Outpatient Prescriptions on File Prior to Visit  Medication Sig Dispense Refill  . ARIPiprazole (ABILIFY) 10 MG tablet Take 10 mg by mouth daily.      Marland Kitchen aspirin 81 MG chewable tablet Chew by mouth daily.      . B Complex-C (SUPER B COMPLEX PO) Take by mouth.      . DULoxetine (CYMBALTA) 60 MG capsule Take 60 mg by mouth daily.      Marland Kitchen esomeprazole (NEXIUM) 40 MG capsule Take 40 mg by mouth 2 (two) times daily before a meal.      . furosemide (LASIX) 80 MG tablet Take 80 mg by mouth daily.      Marland Kitchen gabapentin (NEURONTIN) 600 MG tablet Take 600 mg by mouth 3 (three) times daily.      . irbesartan (AVAPRO) 150 MG tablet Take 150 mg by mouth daily.      . Multiple Vitamins-Minerals (MULTIVITAMIN WITH MINERALS) tablet Take 1 tablet by mouth daily.      . Omega-3 Fatty Acids (FISH OIL) 1000 MG CAPS Take by mouth.      .  oxyCODONE-acetaminophen (PERCOCET) 7.5-325 MG per tablet Take 1 tablet by mouth every 4 (four) hours as needed for pain.      . potassium chloride (KLOR-CON) 20 MEQ packet Take by mouth 2 (two) times daily.      . sennosides-docusate sodium (SENOKOT-S) 8.6-50 MG tablet Take 1 tablet by mouth daily.       No current facility-administered medications on file prior to visit.    Past Surgical History  Procedure Laterality Date  . Cesarean section    . Cholecystectomy      Allergies  Allergen Reactions  . Penicillins     History   Social History  . Marital Status: Single    Spouse Name: N/A    Number of Children: N/A  . Years of Education: N/A   Occupational History  . Not on file.   Social History Main Topics  . Smoking status: Former Games developer  . Smokeless tobacco: Not on file  . Alcohol Use: Not on file  . Drug Use: Not on file  . Sexual Activity: Not on file   Other Topics Concern  . Not on file   Social History Narrative  . No narrative on file    No family history on file.  BP 103/65  Pulse  106  Ht 5\' 5"  (1.651 m)  Wt 280 lb (127.007 kg)  BMI 46.59 kg/m2  Review of Systems: See HPI above.    Objective:  Physical Exam:  Gen: NAD  Right knee: No gross deformity, ecchymoses, swelling. TTP medial joint line.  No other tenderness. FROM. Negative ant/post drawers. Negative valgus/varus testing. Negative lachmanns. Negative mcmurrays, apleys, patellar apprehension. NV intact distally.  Right ankle/foot: No gross deformity, swelling, ecchymoses FROM with pain on full dorsiflexion and plantarflexion. TTP achilles at insertion and just proximal to this. Negative ant drawer and talar tilt.   Negative syndesmotic compression. Thompsons test negative. NV intact distally.    Assessment & Plan:  1. Right knee pain - 2/2 DJD, less likely degenerative meniscus tear.  She would like to continue with conservative measures - discussed tylenol, glucosamine,  capsaicin.  Continue voltaren gel.  S/p intraarticular injection today.  HEP.  Consider PT, arch support.  F/u in 6 weeks or as needed for this issue.  2. Right achilles tendinopathy - discussed home exercises to continue doing (done PT already).  Start nitro patches as she hasn't done this before.  Heel lifts, avoid barefoot walking and flat shoes.  F/u in 6 weeks.  Discussed risks of headaches, skin irritation with nitro patches.  Addendum:  MRI reviewed and discussed with patient. She does have a fairly large meniscus tear - believe this is the cause of her pain and why injections have not given her more extensive relief.  Will refer to orthopedics to discuss arthroscopy.

## 2013-09-08 ENCOUNTER — Telehealth: Payer: Self-pay

## 2013-09-08 NOTE — Telephone Encounter (Signed)
Martha Nolan called and said she needs some esomeprazole (NEXIUM) 40 MG capsule sent in for her.

## 2013-09-11 ENCOUNTER — Telehealth: Payer: Self-pay | Admitting: Family Medicine

## 2013-09-11 ENCOUNTER — Encounter: Payer: Self-pay | Admitting: Family Medicine

## 2013-09-11 DIAGNOSIS — M25561 Pain in right knee: Secondary | ICD-10-CM

## 2013-09-11 NOTE — Telephone Encounter (Signed)
I put in orders for x-rays first - she can get them downstairs at her convenience.  We will likely do an MRI following this depending on those results.  Please notify patient Martha Nolan.  Thanks!

## 2013-09-14 ENCOUNTER — Ambulatory Visit (HOSPITAL_BASED_OUTPATIENT_CLINIC_OR_DEPARTMENT_OTHER)
Admission: RE | Admit: 2013-09-14 | Discharge: 2013-09-14 | Disposition: A | Discharge status: Home / Self Care | Payer: Medicare Other | Source: Ambulatory Visit | Attending: Family Medicine | Admitting: Family Medicine

## 2013-09-14 DIAGNOSIS — M898X9 Other specified disorders of bone, unspecified site: Secondary | ICD-10-CM | POA: Insufficient documentation

## 2013-09-14 DIAGNOSIS — M25569 Pain in unspecified knee: Secondary | ICD-10-CM | POA: Insufficient documentation

## 2013-09-14 DIAGNOSIS — M25561 Pain in right knee: Secondary | ICD-10-CM

## 2013-09-15 ENCOUNTER — Other Ambulatory Visit: Payer: Self-pay | Admitting: *Deleted

## 2013-09-15 DIAGNOSIS — M25561 Pain in right knee: Secondary | ICD-10-CM

## 2013-09-16 ENCOUNTER — Ambulatory Visit (HOSPITAL_BASED_OUTPATIENT_CLINIC_OR_DEPARTMENT_OTHER)
Admission: RE | Admit: 2013-09-16 | Discharge: 2013-09-16 | Disposition: A | Discharge status: Home / Self Care | Payer: Medicare Other | Source: Ambulatory Visit | Attending: Family Medicine | Admitting: Family Medicine

## 2013-09-16 DIAGNOSIS — M23329 Other meniscus derangements, posterior horn of medial meniscus, unspecified knee: Secondary | ICD-10-CM | POA: Insufficient documentation

## 2013-09-16 DIAGNOSIS — M25561 Pain in right knee: Secondary | ICD-10-CM

## 2013-09-16 DIAGNOSIS — M25569 Pain in unspecified knee: Secondary | ICD-10-CM | POA: Diagnosis present

## 2013-09-18 ENCOUNTER — Other Ambulatory Visit: Payer: Self-pay | Admitting: *Deleted

## 2013-09-18 DIAGNOSIS — S83206D Unspecified tear of unspecified meniscus, current injury, right knee, subsequent encounter: Secondary | ICD-10-CM

## 2013-09-23 DIAGNOSIS — B351 Tinea unguium: Secondary | ICD-10-CM | POA: Insufficient documentation

## 2013-10-05 ENCOUNTER — Ambulatory Visit (HOSPITAL_BASED_OUTPATIENT_CLINIC_OR_DEPARTMENT_OTHER)
Admission: RE | Admit: 2013-10-05 | Discharge: 2013-10-05 | Disposition: A | Discharge status: Home / Self Care | Payer: Medicare Other | Source: Ambulatory Visit | Attending: Physician Assistant | Admitting: Physician Assistant

## 2013-10-05 ENCOUNTER — Encounter: Payer: Self-pay | Admitting: Physician Assistant

## 2013-10-05 ENCOUNTER — Other Ambulatory Visit: Payer: Self-pay | Admitting: Physician Assistant

## 2013-10-05 ENCOUNTER — Ambulatory Visit (INDEPENDENT_AMBULATORY_CARE_PROVIDER_SITE_OTHER): Payer: Medicare Other | Admitting: Physician Assistant

## 2013-10-05 VITALS — BP 110/70 | HR 83 | Temp 98.2°F | Ht 66.0 in | Wt 287.0 lb

## 2013-10-05 DIAGNOSIS — M25561 Pain in right knee: Secondary | ICD-10-CM

## 2013-10-05 DIAGNOSIS — I10 Essential (primary) hypertension: Secondary | ICD-10-CM

## 2013-10-05 DIAGNOSIS — M25569 Pain in unspecified knee: Secondary | ICD-10-CM | POA: Insufficient documentation

## 2013-10-05 DIAGNOSIS — Z01818 Encounter for other preprocedural examination: Secondary | ICD-10-CM | POA: Insufficient documentation

## 2013-10-05 DIAGNOSIS — E1165 Type 2 diabetes mellitus with hyperglycemia: Secondary | ICD-10-CM

## 2013-10-05 DIAGNOSIS — G8929 Other chronic pain: Secondary | ICD-10-CM | POA: Diagnosis not present

## 2013-10-05 DIAGNOSIS — Z23 Encounter for immunization: Secondary | ICD-10-CM

## 2013-10-05 DIAGNOSIS — M25562 Pain in left knee: Secondary | ICD-10-CM

## 2013-10-05 DIAGNOSIS — E119 Type 2 diabetes mellitus without complications: Secondary | ICD-10-CM

## 2013-10-05 LAB — PROTIME-INR
INR: 1 ratio (ref 0.8–1.0)
Prothrombin Time: 10.9 s (ref 9.6–13.1)

## 2013-10-05 LAB — CBC WITH DIFFERENTIAL/PLATELET
BASOS ABS: 0 10*3/uL (ref 0.0–0.1)
Basophils Relative: 0.5 % (ref 0.0–3.0)
EOS ABS: 0.2 10*3/uL (ref 0.0–0.7)
EOS PCT: 3.8 % (ref 0.0–5.0)
HEMATOCRIT: 35.8 % — AB (ref 36.0–46.0)
Hemoglobin: 11.8 g/dL — ABNORMAL LOW (ref 12.0–15.0)
LYMPHS ABS: 1.7 10*3/uL (ref 0.7–4.0)
Lymphocytes Relative: 25.9 % (ref 12.0–46.0)
MCHC: 33 g/dL (ref 30.0–36.0)
MCV: 91.8 fl (ref 78.0–100.0)
MONO ABS: 0.4 10*3/uL (ref 0.1–1.0)
Monocytes Relative: 6.6 % (ref 3.0–12.0)
Neutro Abs: 4.1 10*3/uL (ref 1.4–7.7)
Neutrophils Relative %: 63.2 % (ref 43.0–77.0)
PLATELETS: 316 10*3/uL (ref 150.0–400.0)
RBC: 3.9 Mil/uL (ref 3.87–5.11)
RDW: 15.1 % (ref 11.5–15.5)
WBC: 6.5 10*3/uL (ref 4.0–10.5)

## 2013-10-05 LAB — COMPREHENSIVE METABOLIC PANEL
ALK PHOS: 67 U/L (ref 39–117)
ALT: 21 U/L (ref 0–35)
AST: 30 U/L (ref 0–37)
Albumin: 3.9 g/dL (ref 3.5–5.2)
BUN: 19 mg/dL (ref 6–23)
CO2: 28 meq/L (ref 19–32)
Calcium: 9.7 mg/dL (ref 8.4–10.5)
Chloride: 97 mEq/L (ref 96–112)
Creatinine, Ser: 1.2 mg/dL (ref 0.4–1.2)
GFR: 47.93 mL/min — ABNORMAL LOW (ref >60.00)
Glucose, Bld: 100 mg/dL — ABNORMAL HIGH (ref 70–99)
Potassium: 4.3 mEq/L (ref 3.5–5.1)
SODIUM: 137 meq/L (ref 135–145)
TOTAL PROTEIN: 8 g/dL (ref 6.0–8.3)
Total Bilirubin: 0.5 mg/dL (ref 0.2–1.2)

## 2013-10-05 NOTE — Assessment & Plan Note (Signed)
Well controlled.  Continue current regimen. Recent fasting labs within normal limits.  Follow-up twice yearly.

## 2013-10-05 NOTE — Assessment & Plan Note (Signed)
Continue Metformin.  Monitor Carb intake.  Supportive shoes.  Follow-up in 3 months for repeat A1C.  Referral placed to Ophthalmology for diabetic retinal exam.

## 2013-10-05 NOTE — Progress Notes (Signed)
Pre visit review using our clinic review tool, if applicable. No additional management support is needed unless otherwise documented below in the visit note. 

## 2013-10-05 NOTE — Addendum Note (Signed)
Addended by: Silvio Pate D on: 10/05/2013 01:36 PM   Modules accepted: Orders

## 2013-10-05 NOTE — Patient Instructions (Signed)
Please obtain labs.  I will call you with your results.  Proceed down to the Imaging Department on the first floor for an x-ray of your left knee.  I will call you with your results. Continue medications as directed.  I will fax over a letter granting surgical clearance once all of your results are in.  Follow-up with me in 6 months regarding chronic conditions.  Return sooner as needed.

## 2013-10-05 NOTE — Assessment & Plan Note (Signed)
EKG reveals NSR.  Will obtain labs.  Physical Exam unremarkable. Once lab results are in, will fax over medical clearance to surgeon.

## 2013-10-05 NOTE — Progress Notes (Signed)
Patient presents to clinic today to establish care.  Patient also presents to clinic for preoperative clearance.  Patient is scheduled for an upcoming TKR of right knee with Dr. Rosana Berger (sp?) on 10/23/13.  Will need labs and EKG today in addition to Physical Examination.    Patient also c/o left knee pain, chronic, that has worsened over the past couple of months.  Has history of OA, especially in the R knee (see above).  Denies swelling, bruising or decreased ROM.  Pain is throbbing in nature.  Patient taking Cymbalta and Percocet for pain relief.  Has never had imaging of L knee.   Chronic Issues: Hypertension -- Endorses well controlled with Irbesartan and Lasix.  Taking 40 mg Lasix BID for chronic peripheral edema as well.  Denies chest pain, li  OSA -- Wears CPAP at night with good rest.  Diabetes Mellitus -- Currently on Metformin Xr 500 mg BID.  Last A1C at 6.3.  Diabetic Foot exam up-to-date.  Patient has never been evaluated by Ophthalmology.  Health Maintenance: Dental -- up-to-date Vision -- up-to-date Immunizations -- Requesting Flu vaccine at today's visit. Colonoscopy -- up-to-date Mammogram -- Last in 2015.  Due in 2016. PAP -- up-to-date  Past Medical History  Diagnosis Date  . Migraine   . GERD (gastroesophageal reflux disease)   . Internal hemorrhoids     Dr. Conley Rolls (Colon  . Anxiety   . Depression   . Barrett's esophagus     Dr. Nedra Hai (EGD - 03/2013) Q 3 years   . Asthma   . Chronic pain     Low Back L4-S1  . Sleep apnea   . Diabetes mellitus without complication   . Hypertension   . Chronic back pain   . Spinal stenosis     Past Surgical History  Procedure Laterality Date  . Cesarean section    . Carpal tunnel release    . Ulnar nerve repair    . Dilation and curettage of uterus  11/05/2012  . Cesarean section    . Cholecystectomy      Current Outpatient Prescriptions on File Prior to Visit  Medication Sig Dispense Refill  . albuterol (PROVENTIL  HFA;VENTOLIN HFA) 108 (90 BASE) MCG/ACT inhaler Inhale 1 puff into the lungs every 6 (six) hours as needed for wheezing or shortness of breath.      Marland Kitchen aspirin 81 MG chewable tablet Chew by mouth daily.      . B Complex-C (SUPER B COMPLEX PO) Take by mouth.      . diclofenac sodium (VOLTAREN) 1 % GEL Apply 4 g topically 4 (four) times daily.  10 Tube  3  . DULoxetine (CYMBALTA) 60 MG capsule Take 60 mg by mouth daily.      . furosemide (LASIX) 80 MG tablet Take 1/2 - 1 tab daily for fluid  90 tablet  3  . irbesartan (AVAPRO) 150 MG tablet Take 1 tablet (150 mg total) by mouth daily.  90 tablet  3  . metFORMIN (GLUCOPHAGE-XR) 500 MG 24 hr tablet Take 1 tablet (500 mg total) by mouth 2 (two) times daily.  180 tablet  3  . Multiple Vitamin (MULTIVITAMIN) capsule Take 1 capsule by mouth daily.      . Multiple Vitamins-Minerals (MULTIVITAMIN WITH MINERALS) tablet Take 1 tablet by mouth daily.      . nitroGLYCERIN (MINITRAN) 0.2 mg/hr patch Apply 1/4th patch to affected achilles, change daily  30 patch  1  . terbinafine (LAMISIL) 250 MG tablet  Take 1 tablet (250 mg total) by mouth daily.  90 tablet  0  . ARIPiprazole (ABILIFY) 10 MG tablet Take 10 mg by mouth daily.      Marland Kitchen esomeprazole (NEXIUM) 40 MG capsule Take 1 capsule (40 mg total) by mouth 2 (two) times daily before a meal.  180 capsule  3  . gabapentin (NEURONTIN) 600 MG tablet Take 600 mg by mouth 3 (three) times daily.      . potassium chloride SA (K-DUR,KLOR-CON) 20 MEQ tablet Take 1 tablet (20 mEq total) by mouth 2 (two) times daily.  180 tablet  3  . sennosides-docusate sodium (SENOKOT-S) 8.6-50 MG tablet Take 1 tablet by mouth daily.       No current facility-administered medications on file prior to visit.    Allergies  Allergen Reactions  . Penicillins Hives    Family History  Problem Relation Age of Onset  . Hypertension Mother   . Hypertension Father   . Hypertension Maternal Grandmother   . Diabetes Maternal Grandmother   .  Hypertension Maternal Grandfather   . Cervical cancer Paternal Grandmother   . Hypertension Paternal Grandmother   . Hypertension Paternal Grandfather   . Diabetes Paternal Uncle     History   Social History  . Marital Status: Single    Spouse Name: N/A    Number of Children: 1  . Years of Education: 12   Occupational History  . DISABLED     Social History Main Topics  . Smoking status: Former Smoker -- 0.50 packs/day for 35 years    Types: Cigarettes, E-cigarettes    Quit date: 02/05/2013  . Smokeless tobacco: Not on file  . Alcohol Use: No  . Drug Use: No  . Sexual Activity: Yes    Partners: Female   Other Topics Concern  . 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   Occupation:  Disabled     Education:  [employer]    Tobacco Use/Exposure:  She quit smoking in 1/15.     Alcohol Use:  No   Drug Use: No   Diet: Healthy   Exercise: Limited    Hobbies:  Reading               ROS See HPI.  All other ROS are negative.  BP 110/70  Pulse 83  Temp(Src) 98.2 F (36.8 C)  Ht  (1.676 m)  Wt 287 lb (130.182 kg)  BMI 46.35 kg/m2  SpO2 93%  Physical Exam  Vitals reviewed. Constitutional: She is oriented to person, place, and time and well-developed, well-nourished, and in no distress.  HENT:  Head: Normocephalic and atraumatic.  Right Ear: External ear normal.  Left Ear: External ear normal.  Nose: Nose normal.  Mouth/Throat: Oropharynx is clear and moist. No oropharyngeal exudate.  TM within normal limits bilaterally.  Eyes: Conjunctivae are normal. Pupils are equal, round, and reactive to light.  Neck: Neck supple. No thyromegaly present.  Cardiovascular: Normal rate, regular rhythm, normal heart sounds and intact distal  pulses.   Pulmonary/Chest: Effort normal and breath sounds normal. No respiratory distress. She has no wheezes. She has no rales. She exhibits no tenderness.  Musculoskeletal:  Right knee: She exhibits normal range of motion and no swelling. Tenderness found.       Left knee: She exhibits normal range of motion and no swelling. Tenderness found.  Neurological: She is alert and oriented to person, place, and time.  Skin: Skin is warm and dry. No rash noted.  Psychiatric: Affect normal.    Recent Results (from the past 2160 hour(s))  POCT URINALYSIS DIPSTICK     Status: Normal   Collection Time    07/27/13  9:33 AM      Result Value Ref Range   Color, UA yellow     Clarity, UA clear     Glucose, UA neg     Bilirubin, UA neg     Ketones, UA neg     Spec Grav, UA 1.015     Blood, UA neg     pH, UA 7.5     Protein, UA neg     Urobilinogen, UA negative     Nitrite, UA neg     Leukocytes, UA Negative    POCT UA - MICROALBUMIN     Status: Normal   Collection Time    07/27/13  9:54 AM      Result Value Ref Range   Microalbumin Ur, POC 5.0     Creatinine, POC 120.3     Albumin/Creatinine Ratio, Urine, POC 4.2      Assessment/Plan: Essential hypertension, benign Well controlled.  Continue current regimen. Recent fasting labs within normal limits.  Follow-up twice yearly.  Type II or unspecified type diabetes mellitus without mention of complication, not stated as uncontrolled Continue Metformin.  Monitor Carb intake.  Supportive shoes.  Follow-up in 3 months for repeat A1C.  Referral placed to Ophthalmology for diabetic retinal exam.  Knee pain, bilateral Will obtain x-ray of L knee to further assess.  Knee brace given.  Patient with upcoming TKR of R knee.  Preoperative clearance EKG reveals NSR.  Will obtain labs.  Physical Exam unremarkable. Once lab results are in, will fax over medical clearance to surgeon.

## 2013-10-05 NOTE — Assessment & Plan Note (Signed)
Will obtain x-ray of L knee to further assess.  Knee brace given.  Patient with upcoming TKR of R knee.

## 2013-10-06 ENCOUNTER — Encounter: Payer: Self-pay | Admitting: Physician Assistant

## 2013-10-06 LAB — URINALYSIS, ROUTINE W REFLEX MICROSCOPIC
BILIRUBIN URINE: NEGATIVE
HGB URINE DIPSTICK: NEGATIVE
Ketones, ur: NEGATIVE
LEUKOCYTES UA: NEGATIVE
Nitrite: NEGATIVE
RBC / HPF: NONE SEEN (ref 0–?)
Specific Gravity, Urine: <=1.005 — AB (ref 1.000–1.030)
Total Protein, Urine: NEGATIVE
URINE GLUCOSE: NEGATIVE
Urobilinogen, UA: 0.2 (ref 0.0–1.0)
WBC, UA: NONE SEEN (ref 0–?)
pH: 6 (ref 5.0–8.0)

## 2013-10-15 ENCOUNTER — Ambulatory Visit: Payer: Medicare Other | Admitting: Family Medicine

## 2013-11-12 ENCOUNTER — Ambulatory Visit: Payer: Medicare Other | Admitting: Family Medicine

## 2014-04-05 ENCOUNTER — Ambulatory Visit: Payer: Self-pay | Admitting: Physician Assistant

## 2019-01-20 ENCOUNTER — Other Ambulatory Visit: Payer: Self-pay

## 2019-01-20 ENCOUNTER — Emergency Department (HOSPITAL_COMMUNITY)
Admission: EM | Admit: 2019-01-20 | Discharge: 2019-01-21 | Disposition: A | Discharge status: Home / Self Care | Payer: Medicare Other | Attending: Emergency Medicine | Admitting: Emergency Medicine

## 2019-01-20 DIAGNOSIS — Z7982 Long term (current) use of aspirin: Secondary | ICD-10-CM | POA: Insufficient documentation

## 2019-01-20 DIAGNOSIS — J45909 Unspecified asthma, uncomplicated: Secondary | ICD-10-CM | POA: Diagnosis not present

## 2019-01-20 DIAGNOSIS — Z87891 Personal history of nicotine dependence: Secondary | ICD-10-CM | POA: Diagnosis not present

## 2019-01-20 DIAGNOSIS — I10 Essential (primary) hypertension: Secondary | ICD-10-CM | POA: Diagnosis not present

## 2019-01-20 DIAGNOSIS — M25552 Pain in left hip: Secondary | ICD-10-CM | POA: Diagnosis not present

## 2019-01-20 DIAGNOSIS — Z79899 Other long term (current) drug therapy: Secondary | ICD-10-CM | POA: Diagnosis not present

## 2019-01-20 DIAGNOSIS — E119 Type 2 diabetes mellitus without complications: Secondary | ICD-10-CM | POA: Insufficient documentation

## 2019-01-20 DIAGNOSIS — R202 Paresthesia of skin: Secondary | ICD-10-CM | POA: Diagnosis not present

## 2019-01-20 DIAGNOSIS — Z7984 Long term (current) use of oral hypoglycemic drugs: Secondary | ICD-10-CM | POA: Diagnosis not present

## 2019-01-20 NOTE — ED Triage Notes (Addendum)
Pt reports L hip pain. She was in a standing position, pivoted and felt a pop. No shortening noted. 200 fentanyl given route. 20 LAC. States that she is unable to ambulate.

## 2019-01-21 ENCOUNTER — Encounter (HOSPITAL_COMMUNITY): Payer: Self-pay

## 2019-01-21 ENCOUNTER — Emergency Department (HOSPITAL_COMMUNITY): Payer: Medicare Other

## 2019-01-21 MED ORDER — HYDROCODONE-ACETAMINOPHEN 5-325 MG PO TABS: 1.0000 | ORAL_TABLET | Freq: Four times a day (QID) | ORAL | 0 refills | Status: DC | PRN

## 2019-01-21 MED ORDER — OXYCODONE-ACETAMINOPHEN 5-325 MG PO TABS
1.0000 | ORAL_TABLET | Freq: Once | ORAL | Status: AC
  Administered 2019-01-21: 03:00:00 1 via ORAL
  Filled 2019-01-21: qty 1

## 2019-01-21 NOTE — ED Notes (Signed)
Patient ambulated around room with minimal assistance from this nurse, states she does not feel dizzy but has complaints of pain while walking. Patient uses a cane as needed at home.

## 2019-01-21 NOTE — ED Provider Notes (Signed)
[facility]-EMERGENCY DEPT Provider Note   CSN: [id number] Arrival date & time: 01/20/19  2344     History Chief Complaint  Patient presents with  . Hip Pain    Martha Nolan is a 63 y.o. female.  HPI     This is a 63 year old with a history of diabetes, hypertension who presents with left hip pain.  Patient reports that she was taking off on her right sock when she shifted her weight to her left leg while standing.  She felt a pop and fell backwards onto her bed.  Since that time she has not been able to bear weight.  She is never had any issue with her left hip.  She rates her pain currently at 2 out of 10 after receiving fentanyl by EMS.  EMS initially noted deformity but upon arrival feels like her deformity has improved.  Patient reports worsening pain with range of motion.  Patient does report some tingling in the left lower extremity.  Past Medical History:  Diagnosis Date  . Anxiety   . Asthma   . Barrett's esophagus    Dr. Nedra Hai (EGD - 03/2013) Q 3 years   . Chronic back pain   . Chronic pain    Low Back L4-S1  . Depression   . Diabetes mellitus without complication (HCC)   . GERD (gastroesophageal reflux disease)   . Hypertension   . Internal hemorrhoids    Dr. Conley Rolls (Colon  . Migraine   . Sleep apnea   . Spinal stenosis     Patient Active Problem List   Diagnosis Date Noted  . Preoperative clearance 10/05/2013  . Toenail fungus 09/23/2013  . Right Achilles tendinitis 09/07/2013  . Right knee pain 08/10/2013  . Anxiety state, unspecified 07/26/2013  . Impaired fasting glucose 07/26/2013  . Swelling of limb 07/26/2013  . Type II or unspecified type diabetes mellitus without mention of complication, not stated as uncontrolled 07/26/2013  . Knee pain, bilateral 07/26/2013  . Other malaise and fatigue 07/26/2013  . Other and unspecified hyperlipidemia 07/26/2013  . Unspecified vitamin D deficiency 07/26/2013  . Essential hypertension,  benign 06/17/2013  . GERD (gastroesophageal reflux disease) 06/17/2013  . Migraine, unspecified, without mention of intractable migraine without mention of status migrainosus 06/17/2013    Past Surgical History:  Procedure Laterality Date  . CARPAL TUNNEL RELEASE    . CESAREAN SECTION    . CESAREAN SECTION    . CHOLECYSTECTOMY    . DILATION AND CURETTAGE OF UTERUS  11/05/2012  . ULNAR NERVE REPAIR       OB History   No obstetric history on file.     Family History  Problem Relation Age of Onset  . Hypertension Mother   . Hypertension Father   . Hypertension Maternal Grandmother   . Diabetes Maternal Grandmother   . Hypertension Maternal Grandfather   . Cervical cancer Paternal Grandmother   . Hypertension Paternal Grandmother   . Hypertension Paternal Grandfather   . Diabetes Paternal Uncle     Social History   Tobacco Use  . Smoking status: Former Smoker    Packs/day: 0.50    Years: 35.00    Pack years: 17.50    Types: Cigarettes, E-cigarettes    Quit date: 02/05/2013    Years since quitting: 5.9  Substance Use Topics  . Alcohol use: No  . Drug use: No    Home Medications Prior to Admission medications   Medication Sig Start Date End  Date Taking? Authorizing Provider  albuterol (PROVENTIL HFA;VENTOLIN HFA) 108 (90 BASE) MCG/ACT inhaler Inhale 1 puff into the lungs every 6 (six) hours as needed for wheezing or shortness of breath.    [provider]  ARIPiprazole (ABILIFY) 10 MG tablet Take 10 mg by mouth daily.    [provider]  aspirin 81 MG chewable tablet Chew by mouth daily.    [provider]  B Complex-C (SUPER B COMPLEX PO) Take by mouth.    [provider]  DULoxetine (CYMBALTA) 60 MG capsule Take 60 mg by mouth daily.    [provider]  esomeprazole (NEXIUM) 40 MG capsule Take 1 capsule (40 mg total) by mouth 2 (two) times daily before a meal. 06/17/13 06/18/14  Zanard, Hinton Dyer, MD  furosemide (LASIX) 80 MG  tablet Take 1/2 - 1 tab daily for fluid 06/17/13 06/18/14  Zanard, Hinton Dyer, MD  gabapentin (NEURONTIN) 600 MG tablet Take 600 mg by mouth 3 (three) times daily.    [provider]  HYDROcodone-acetaminophen (NORCO/VICODIN) 5-325 MG tablet Take 1 tablet by mouth every 6 (six) hours as needed. 01/21/19   Toshi Ishii, Mayer Masker, MD  irbesartan (AVAPRO) 150 MG tablet Take 1 tablet (150 mg total) by mouth daily. 06/17/13 06/18/14  Gillian Scarce, MD  metFORMIN (GLUCOPHAGE-XR) 500 MG 24 hr tablet Take 1 tablet (500 mg total) by mouth 2 (two) times daily. 06/17/13   Gillian Scarce, MD  Multiple Vitamin (MULTIVITAMIN) capsule Take 1 capsule by mouth daily.    [provider]  Multiple Vitamins-Minerals (MULTIVITAMIN WITH MINERALS) tablet Take 1 tablet by mouth daily.    [provider]  nitroGLYCERIN [facility]) 0.2 mg/hr patch Apply 1/4th patch to affected achilles, change daily 09/03/13   Hudnall, Azucena Fallen, MD  potassium chloride SA (K-DUR,KLOR-CON) 20 MEQ tablet Take 1 tablet (20 mEq total) by mouth 2 (two) times daily. 06/17/13 06/18/14  Gillian Scarce, MD  sennosides-docusate sodium (SENOKOT-S) 8.6-50 MG tablet Take 1 tablet by mouth daily.    [provider]    Allergies    Penicillins  Review of Systems   Review of Systems  Musculoskeletal:       Left hip pain  Neurological: Negative for weakness.  All other systems reviewed and are negative.   Physical Exam Updated Vital Signs BP 123/70   Pulse 83   Temp 98 F (36.7 C) (Oral)   Resp 16   Ht 1.651 m (5\' 5" )   Wt 122 kg   SpO2 100%   BMI 44.76 kg/m   Physical Exam Vitals and nursing note reviewed.  Constitutional:      Appearance: She is well-developed. She is obese. She is not ill-appearing.  HENT:     Head: Normocephalic and atraumatic.  Eyes:     Pupils: Pupils are equal, round, and reactive to light.  Cardiovascular:     Rate and Rhythm: Normal rate and regular rhythm.     Heart sounds: Normal  heart sounds.  Pulmonary:     Effort: Pulmonary effort is normal. No respiratory distress.     Breath sounds: No wheezing.  Abdominal:     Palpations: Abdomen is soft.     Tenderness: There is no abdominal tenderness.  Musculoskeletal:        General: No deformity.     Cervical back: Neck supple.     Comments: Limited range of motion secondary to pain, no obvious malrotation, deformity, or foreshortening  Skin:  General: Skin is warm and dry.  Neurological:     Mental Status: She is alert and oriented to person, place, and time.  Psychiatric:        Mood and Affect: Mood normal.     ED Results / Procedures / Treatments   Labs (all labs ordered are listed, but only abnormal results are displayed) Labs Reviewed - No data to display  EKG None  Radiology CT Hip Left Wo Contrast  Result Date: 01/21/2019 CLINICAL DATA:  Acute left hip pain. Pain onset while standing and pivoting, felt a pop. EXAM: CT OF THE LEFT HIP WITHOUT CONTRAST TECHNIQUE: Multidetector CT imaging of the left hip was performed according to the standard protocol. Multiplanar CT image reconstructions were also generated. COMPARISON:  Radiograph earlier this day. FINDINGS: Bones/Joint/Cartilage No acute fracture. Femoral head is well seated in the acetabulum. No avascular necrosis. Pubic rami and acetabulum are intact. No significant joint effusion. Ligaments Suboptimally assessed by CT. Muscles and Tendons Slight increased density and loss of normal fat plane of the anterior gluteus medius muscle, possible intramuscular hematoma. This is not entirely included in the field of view. Soft tissues Soft tissue edema laterally. Urinary bladder is distended, partially included. IMPRESSION: 1. No fracture or acute osseous abnormality of the left hip. 2. Slight increased density and loss of normal fat plane of the anterior gluteus medius muscle, possible intramuscular hematoma, not entirely included in the field of view. Mild  soft tissue edema laterally. Electronically Signed   By: Keith Rake M.D.   On: 01/21/2019 03:44   DG Hip Unilat W or Wo Pelvis 2-3 Views Left  Result Date: 01/21/2019 CLINICAL DATA:  Left hip pain after injury. EXAM: DG HIP (WITH OR WITHOUT PELVIS) 2-3V LEFT COMPARISON:  None. FINDINGS: The cortical margins of the bony pelvis and left hip are intact. No fracture. Pubic symphysis and sacroiliac joints are congruent. Mild bilateral sacroiliac degenerative change. Both femoral heads are well-seated in the respective acetabula. Soft tissue attenuation from habitus limits detailed assessment. IMPRESSION: No fracture of the pelvis or left hip. Electronically Signed   By: Keith Rake M.D.   On: 01/21/2019 01:16    Procedures Procedures (including critical care time)  Medications Ordered in ED Medications  oxyCODONE-acetaminophen (PERCOCET/ROXICET) 5-325 MG per tablet 1 tablet (1 tablet Oral Given 01/21/19 0231)    ED Course  I have reviewed the triage vital signs and the nursing notes.  Pertinent labs & imaging results that were available during my care of the patient were reviewed by me and considered in my medical decision making (see chart for details).    MDM Rules/Calculators/A&P                      Patient presents with left hip pain.  She is overall nontoxic and vital signs are reassuring.  EMS noted deformity that resolved upon arrival.  Limited range of motion secondary to pain but no obvious deformity or foreshortening.  X-rays without obvious fracture.  Patient can weight-bear but has significant pain.  Will obtain advanced imaging to rule out occult fracture.  It would be very unlikely for patient to have dislocated and have a spontaneous relocation.  CT scan does not show evidence of fracture.  There is a small hematoma in the gluteus muscle.  This could be suggestive of trauma or subluxation of the joint.  Again would think this would be rare.  However, will treat  conservatively with a knee immobilizer  and crutches.  Will provide orthopedic follow-up.  Patient will be given a prescription for pain medication.  Also recommend applying ice.  After history, exam, and medical workup I feel the patient has been appropriately medically screened and is safe for discharge home. Pertinent diagnoses were discussed with the patient. Patient was given return precautions.    Final Clinical Impression(s) / ED Diagnoses Final diagnoses:  Left hip pain    Rx / DC Orders ED Discharge Orders         Ordered    HYDROcodone-acetaminophen (NORCO/VICODIN) 5-325 MG tablet  Every 6 hours PRN     01/21/19 0407           Shon BatonHorton, Joline Encalada F, MD 01/21/19 0410

## 2019-01-21 NOTE — Discharge Instructions (Signed)
You were seen today for left hip pain.  Your imaging is largely reassuring.  You do have some evidence of hematoma in the muscle adjacent to the hip.  While it would be very uncommon for you to have dislocated or subluxed her hip with spontaneous relocation, this is a possibility.  Use crutches for touchdown weightbearing.  Follow-up with orthopedics.  Apply ice and take pain medications as needed.

## 2019-01-21 NOTE — ED Notes (Signed)
Pt undressed completely and placed on the monitor. Pt has call bell within reach.

## 2019-01-21 NOTE — ED Provider Notes (Signed)
Pharmacist called, patient received 75 7.5 mg hydrocodone on 12/14. Will recommend pharmacy not filling Rx given By Dr. Deliah Boston, MD 01/21/19 [phone]

## 2019-06-29 ENCOUNTER — Other Ambulatory Visit: Payer: Self-pay

## 2019-06-29 ENCOUNTER — Encounter (HOSPITAL_COMMUNITY): Payer: Self-pay | Admitting: *Deleted

## 2019-06-29 ENCOUNTER — Emergency Department (HOSPITAL_COMMUNITY)
Admission: EM | Admit: 2019-06-29 | Discharge: 2019-06-29 | Disposition: A | Discharge status: Home / Self Care | Payer: Medicare HMO | Attending: Emergency Medicine | Admitting: Emergency Medicine

## 2019-06-29 DIAGNOSIS — E119 Type 2 diabetes mellitus without complications: Secondary | ICD-10-CM | POA: Insufficient documentation

## 2019-06-29 DIAGNOSIS — J45909 Unspecified asthma, uncomplicated: Secondary | ICD-10-CM | POA: Diagnosis not present

## 2019-06-29 DIAGNOSIS — Z7982 Long term (current) use of aspirin: Secondary | ICD-10-CM | POA: Insufficient documentation

## 2019-06-29 DIAGNOSIS — Z79899 Other long term (current) drug therapy: Secondary | ICD-10-CM | POA: Insufficient documentation

## 2019-06-29 DIAGNOSIS — Z7984 Long term (current) use of oral hypoglycemic drugs: Secondary | ICD-10-CM | POA: Insufficient documentation

## 2019-06-29 DIAGNOSIS — Z87891 Personal history of nicotine dependence: Secondary | ICD-10-CM | POA: Insufficient documentation

## 2019-06-29 DIAGNOSIS — K0889 Other specified disorders of teeth and supporting structures: Secondary | ICD-10-CM | POA: Diagnosis present

## 2019-06-29 DIAGNOSIS — K1370 Unspecified lesions of oral mucosa: Secondary | ICD-10-CM | POA: Diagnosis not present

## 2019-06-29 DIAGNOSIS — K029 Dental caries, unspecified: Secondary | ICD-10-CM

## 2019-06-29 DIAGNOSIS — I10 Essential (primary) hypertension: Secondary | ICD-10-CM | POA: Diagnosis not present

## 2019-06-29 MED ORDER — BUPIVACAINE-EPINEPHRINE (PF) 0.5% -1:200000 IJ SOLN
1.8000 mL | Freq: Once | INTRAMUSCULAR | Status: AC
  Administered 2019-06-29: 1.8 mL
  Filled 2019-06-29: qty 1.8

## 2019-06-29 NOTE — ED Provider Notes (Signed)
[facility]-EMERGENCY DEPT Provider Note   CSN: [id number] Arrival date & time: 06/29/19  9702     History Chief Complaint  Patient presents with  . Dental Pain    Martha Nolan is a 64 y.o. female with a hx of asthma, anxiety, Barrett's esophagus, chronic pain on chronic narcotics, diabetes, hypertension presents to the Emergency Department complaining of gradual, persistent, progressively worsening right lower dental pain onset several days ago.  Patient reports she saw an urgent care this morning and was started on clindamycin but tonight the pain became more intense and she was unable to sleep.  She reports taking her usual scheduled dose of oxycodone 7.5-325 and an additional half tablet without relief.  She denies fevers or chills, change in voice, difficulty swallowing, swelling of face or throat.  Patient reports she is unable to see a dentist due to financial constraints.  Movement, palpation, eating and drinking all make her pain worse.  Nothing seems to make it better.  The history is provided by the patient and medical records. No language interpreter was used.       Past Medical History:  Diagnosis Date  . Anxiety   . Asthma   . Barrett's esophagus    Dr. Nedra Hai (EGD - 03/2013) Q 3 years   . Chronic back pain   . Chronic pain    Low Back L4-S1  . Depression   . Diabetes mellitus without complication (HCC)   . GERD (gastroesophageal reflux disease)   . Hypertension   . Internal hemorrhoids    Dr. Conley Rolls ([place]  . Migraine   . Sleep apnea   . Spinal stenosis     Patient Active Problem List   Diagnosis Date Noted  . Preoperative clearance 10/05/2013  . Toenail fungus 09/23/2013  . Right Achilles tendinitis 09/07/2013  . Right knee pain 08/10/2013  . Anxiety state, unspecified 07/26/2013  . Impaired fasting glucose 07/26/2013  . Swelling of limb 07/26/2013  . Type II or unspecified type diabetes mellitus without mention of complication, not  stated as uncontrolled 07/26/2013  . Knee pain, bilateral 07/26/2013  . Other malaise and fatigue 07/26/2013  . Other and unspecified hyperlipidemia 07/26/2013  . Unspecified vitamin D deficiency 07/26/2013  . Essential hypertension, benign 06/17/2013  . GERD (gastroesophageal reflux disease) 06/17/2013  . Migraine, unspecified, without mention of intractable migraine without mention of status migrainosus 06/17/2013    Past Surgical History:  Procedure Laterality Date  . CARPAL TUNNEL RELEASE    . CESAREAN SECTION    . CESAREAN SECTION    . CHOLECYSTECTOMY    . DILATION AND CURETTAGE OF UTERUS  11/05/2012  . ULNAR NERVE REPAIR       OB History   No obstetric history on file.     Family History  Problem Relation Age of Onset  . Hypertension Mother   . Hypertension Father   . Hypertension Maternal Grandmother   . Diabetes Maternal Grandmother   . Hypertension Maternal Grandfather   . Cervical cancer Paternal Grandmother   . Hypertension Paternal Grandmother   . Hypertension Paternal Grandfather   . Diabetes Paternal Uncle     Social History   Tobacco Use  . Smoking status: Former Smoker    Packs/day: 0.50    Years: 35.00    Pack years: 17.50    Types: Cigarettes, E-cigarettes    Quit date: 02/05/2013    Years since quitting: 6.3  Substance Use Topics  . Alcohol use: No  .  Drug use: No    Home Medications Prior to Admission medications   Medication Sig Start Date End Date Taking? Authorizing Provider  albuterol (PROVENTIL HFA;VENTOLIN HFA) 108 (90 BASE) MCG/ACT inhaler Inhale 1 puff into the lungs every 6 (six) hours as needed for wheezing or shortness of breath.    [provider]  ARIPiprazole (ABILIFY) 10 MG tablet Take 10 mg by mouth daily.    [provider]  aspirin 81 MG chewable tablet Chew by mouth daily.    [provider]  B Complex-C (SUPER B COMPLEX PO) Take by mouth.    [provider]  DULoxetine (CYMBALTA) 60  MG capsule Take 60 mg by mouth daily.    [provider]  esomeprazole (NEXIUM) 40 MG capsule Take 1 capsule (40 mg total) by mouth 2 (two) times daily before a meal. 06/17/13 06/18/14  Zanard, Bernadene Bell, MD  furosemide (LASIX) 80 MG tablet Take 1/2 - 1 tab daily for fluid 06/17/13 06/18/14  Zanard, Bernadene Bell, MD  gabapentin (NEURONTIN) 600 MG tablet Take 600 mg by mouth 3 (three) times daily.    [provider]  irbesartan (AVAPRO) 150 MG tablet Take 1 tablet (150 mg total) by mouth daily. 06/17/13 06/18/14  Jonathon Resides, MD  metFORMIN (GLUCOPHAGE-XR) 500 MG 24 hr tablet Take 1 tablet (500 mg total) by mouth 2 (two) times daily. 06/17/13   Jonathon Resides, MD  Multiple Vitamin (MULTIVITAMIN) capsule Take 1 capsule by mouth daily.    [provider]  Multiple Vitamins-Minerals (MULTIVITAMIN WITH MINERALS) tablet Take 1 tablet by mouth daily.    [provider]  nitroGLYCERIN [facility]) 0.2 mg/hr patch Apply 1/4th patch to affected achilles, change daily 09/03/13   Hudnall, Sharyn Lull, MD  potassium chloride SA (K-DUR,KLOR-CON) 20 MEQ tablet Take 1 tablet (20 mEq total) by mouth 2 (two) times daily. 06/17/13 06/18/14  Jonathon Resides, MD  sennosides-docusate sodium (SENOKOT-S) 8.6-50 MG tablet Take 1 tablet by mouth daily.    [provider]    Allergies    Penicillins  Review of Systems   Review of Systems  Constitutional: Negative for appetite change, chills and fever.  HENT: Positive for dental problem and ear pain. Negative for drooling, facial swelling, nosebleeds, postnasal drip, rhinorrhea and trouble swallowing.   Eyes: Negative for pain and redness.  Respiratory: Negative for cough and wheezing.   Cardiovascular: Negative for chest pain.  Gastrointestinal: Negative for abdominal pain, nausea and vomiting.  Musculoskeletal: Negative for neck pain and neck stiffness.  Skin: Negative for color change and rash.  Neurological: Positive for headaches.  Negative for weakness and light-headedness.  All other systems reviewed and are negative.   Physical Exam Updated Vital Signs BP (!) 151/77 (BP Location: Right Arm)   Pulse 81   Temp 98.1 F (36.7 C) (Oral)   Resp 16   SpO2 97%   Physical Exam Vitals and nursing note reviewed.  Constitutional:      Appearance: She is well-developed.  HENT:     Head: Normocephalic.     Right Ear: Tympanic membrane, ear canal and external ear normal.     Left Ear: Tympanic membrane, ear canal and external ear normal.     Nose: Nose normal.     Right Sinus: No maxillary sinus tenderness or frontal sinus tenderness.     Left Sinus: No maxillary sinus tenderness or frontal sinus tenderness.     Mouth/Throat:     Mouth: No lacerations or  oral lesions.     Dentition: Abnormal dentition. Dental caries present.     Pharynx: Uvula midline. No oropharyngeal exudate, posterior oropharyngeal erythema or uvula swelling.     Tonsils: No tonsillar abscesses.      Comments: Upper jaw edentulous.  Gingiva without erythema or edema. Lower jaw with minimal teeth.  Dental caries throughout in all teeth. Mucosal membrane of the lower lip with white, plaque-like lesion; no bleeding or ulceration. No induration, tenderness or swelling to the sublingual space. Eyes:     General:        Right eye: No discharge.        Left eye: No discharge.     Conjunctiva/sclera: Conjunctivae normal.  Neck:     Comments: No stridor Handling secretions without difficulty No nuchal rigidity No cervical lymphadenopathy Cardiovascular:     Rate and Rhythm: Normal rate and regular rhythm.     Heart sounds: Normal heart sounds.  Pulmonary:     Effort: Pulmonary effort is normal. No respiratory distress.  Abdominal:     General: There is no distension.     Palpations: Abdomen is soft.  Musculoskeletal:     Cervical back: Normal range of motion and neck supple.  Lymphadenopathy:     Cervical: No cervical adenopathy.  Skin:     General: Skin is warm and dry.  Neurological:     Mental Status: She is alert.     ED Results / Procedures / Treatments    Procedures Dental Block  Date/Time: 06/29/2019 5:05 AM Performed by: Dierdre Forth, PA-C Authorized by: Dierdre Forth, PA-C   Consent:    Consent obtained:  Verbal   Consent given by:  Patient   Risks discussed:  Infection, allergic reaction, nerve damage, swelling, hematoma, intravascular injection, pain and unsuccessful block   Alternatives discussed:  No treatment Indications:    Indications: dental pain   Location:    Block type:  Supraperiosteal Procedure details (see MAR for exact dosages):    Syringe type:  Controlled syringe   Needle gauge:  27 G   Anesthetic injected:  Bupivacaine 0.5% WITH epi   Injection procedure:  Anatomic landmarks identified, anatomic landmarks palpated, introduced needle and incremental injection Post-procedure details:    Outcome:  Pain relieved   Patient tolerance of procedure:  Tolerated well, no immediate complications   (including critical care time)  Medications Ordered in ED Medications  bupivacaine-epinephrine (MARCAINE W/ EPI) 0.5% -1:200000 injection 1.8 mL (1.8 mLs Infiltration Given by Other 06/29/19 3382)    ED Course  I have reviewed the triage vital signs and the nursing notes.  Pertinent labs & imaging results that were available during my care of the patient were reviewed by me and considered in my medical decision making (see chart for details).    MDM Rules/Calculators/A&P                      Patient with toothache.  No gross abscess.  Exam unconcerning for Ludwig's angina or spread of infection.  Patient currently being treated with clindamycin.  Dental block given with good pain relief.  Discussed lesion of the lower gingiva as concerning for possible cancer.  She will need biopsy of this.  Patient states understanding.  Urged patient to follow-up with dentist.  Discussed return  precautions.   Final Clinical Impression(s) / ED Diagnoses Final diagnoses:  Dental caries  Pain due to dental caries  Oral lesion    Rx / DC Orders  ED Discharge Orders    None       Anastasya Jewell, Boyd Kerbs 06/29/19 3825    Zadie Rhine, MD 06/29/19 [phone]

## 2019-06-29 NOTE — ED Triage Notes (Signed)
Pt arrives with c/o right upper dental pain for a week. Denies fevers

## 2019-06-29 NOTE — Discharge Instructions (Signed)
1. Medications: Continue clindamycin, usual home medications 2. Treatment: rest, drink plenty of fluids, take medications as prescribed 3. Follow Up: Please followup with dentistry within 1 week for discussion of your diagnoses and further evaluation after today's visit; if you do not have a primary care doctor use the resource guide provided to find one; Return to the ER for high fevers, difficulty breathing, difficulty swallowing or other concerning symptoms

## 2019-06-29 NOTE — ED Notes (Signed)
Signature pad not attached, pt verbalized understanding of discharge paperwork

## 2019-09-02 ENCOUNTER — Other Ambulatory Visit: Payer: Self-pay | Admitting: Anesthesiology

## 2019-09-02 ENCOUNTER — Other Ambulatory Visit: Payer: Self-pay

## 2019-09-02 ENCOUNTER — Ambulatory Visit
Admission: RE | Admit: 2019-09-02 | Discharge: 2019-09-02 | Disposition: A | Discharge status: Home / Self Care | Payer: Medicare HMO | Source: Ambulatory Visit | Attending: Anesthesiology | Admitting: Anesthesiology

## 2019-09-02 DIAGNOSIS — M1711 Unilateral primary osteoarthritis, right knee: Secondary | ICD-10-CM

## 2019-10-06 ENCOUNTER — Ambulatory Visit (HOSPITAL_COMMUNITY)
Admission: RE | Admit: 2019-10-06 | Discharge: 2019-10-06 | Disposition: A | Discharge status: Home / Self Care | Payer: Medicare HMO | Source: Ambulatory Visit | Attending: Internal Medicine | Admitting: Internal Medicine

## 2019-10-06 ENCOUNTER — Other Ambulatory Visit: Payer: Self-pay

## 2019-10-06 ENCOUNTER — Other Ambulatory Visit: Payer: Self-pay | Admitting: Internal Medicine

## 2019-10-06 DIAGNOSIS — M7989 Other specified soft tissue disorders: Secondary | ICD-10-CM | POA: Insufficient documentation

## 2021-02-27 ENCOUNTER — Other Ambulatory Visit: Payer: Self-pay | Admitting: Family Medicine

## 2021-02-27 ENCOUNTER — Ambulatory Visit
Admission: RE | Admit: 2021-02-27 | Discharge: 2021-02-27 | Disposition: A | Discharge status: Home / Self Care | Payer: Medicare Other | Source: Ambulatory Visit | Attending: Family Medicine | Admitting: Family Medicine

## 2021-02-27 ENCOUNTER — Other Ambulatory Visit: Payer: Self-pay

## 2021-02-27 DIAGNOSIS — J40 Bronchitis, not specified as acute or chronic: Secondary | ICD-10-CM

## 2021-03-16 ENCOUNTER — Ambulatory Visit
Admission: RE | Admit: 2021-03-16 | Discharge: 2021-03-16 | Disposition: A | Discharge status: Home / Self Care | Payer: Medicare Other | Source: Ambulatory Visit | Attending: Student | Admitting: Student

## 2021-03-16 ENCOUNTER — Other Ambulatory Visit: Payer: Self-pay

## 2021-03-16 ENCOUNTER — Other Ambulatory Visit: Payer: Self-pay | Admitting: Student

## 2021-03-16 DIAGNOSIS — M7989 Other specified soft tissue disorders: Secondary | ICD-10-CM

## 2021-03-16 DIAGNOSIS — Z9181 History of falling: Secondary | ICD-10-CM

## 2021-03-16 DIAGNOSIS — W19XXXA Unspecified fall, initial encounter: Secondary | ICD-10-CM

## 2021-03-19 ENCOUNTER — Emergency Department (HOSPITAL_COMMUNITY): Payer: Medicare Other

## 2021-03-19 ENCOUNTER — Encounter (HOSPITAL_COMMUNITY): Payer: Self-pay | Admitting: Emergency Medicine

## 2021-03-19 ENCOUNTER — Emergency Department (HOSPITAL_COMMUNITY)
Admission: EM | Admit: 2021-03-19 | Discharge: 2021-03-19 | Disposition: A | Discharge status: Home / Self Care | Payer: Medicare Other | Attending: Emergency Medicine | Admitting: Emergency Medicine

## 2021-03-19 ENCOUNTER — Other Ambulatory Visit: Payer: Self-pay

## 2021-03-19 DIAGNOSIS — R208 Other disturbances of skin sensation: Secondary | ICD-10-CM | POA: Insufficient documentation

## 2021-03-19 DIAGNOSIS — M7918 Myalgia, other site: Secondary | ICD-10-CM | POA: Insufficient documentation

## 2021-03-19 DIAGNOSIS — R52 Pain, unspecified: Secondary | ICD-10-CM

## 2021-03-19 LAB — COMPREHENSIVE METABOLIC PANEL
ALT: 31 U/L (ref 0–44)
AST: 40 U/L (ref 15–41)
Albumin: 3.9 g/dL (ref 3.5–5.0)
Alkaline Phosphatase: 50 U/L (ref 38–126)
Anion gap: 11 (ref 5–15)
BUN: 13 mg/dL (ref 8–23)
CO2: 25 mmol/L (ref 22–32)
Calcium: 9.7 mg/dL (ref 8.9–10.3)
Chloride: 102 mmol/L (ref 98–111)
Creatinine, Ser: 1.25 mg/dL — ABNORMAL HIGH (ref 0.44–1.00)
GFR, Estimated: 48 mL/min — ABNORMAL LOW (ref >60)
Glucose, Bld: 110 mg/dL — ABNORMAL HIGH (ref 70–99)
Potassium: 3.9 mmol/L (ref 3.5–5.1)
Sodium: 138 mmol/L (ref 135–145)
Total Bilirubin: 0.4 mg/dL (ref 0.3–1.2)
Total Protein: 6.9 g/dL (ref 6.5–8.1)

## 2021-03-19 LAB — CBC WITH DIFFERENTIAL/PLATELET
Abs Immature Granulocytes: 0.04 10*3/uL (ref 0.00–0.07)
Basophils Absolute: 0.1 10*3/uL (ref 0.0–0.1)
Basophils Relative: 1 %
Eosinophils Absolute: 0.4 10*3/uL (ref 0.0–0.5)
Eosinophils Relative: 4 %
HCT: 43.3 % (ref 36.0–46.0)
Hemoglobin: 14 g/dL (ref 12.0–15.0)
Immature Granulocytes: 1 %
Lymphocytes Relative: 24 %
Lymphs Abs: 2 10*3/uL (ref 0.7–4.0)
MCH: 31 pg (ref 26.0–34.0)
MCHC: 32.3 g/dL (ref 30.0–36.0)
MCV: 96 fL (ref 80.0–100.0)
Monocytes Absolute: 0.6 10*3/uL (ref 0.1–1.0)
Monocytes Relative: 7 %
Neutro Abs: 5.4 10*3/uL (ref 1.7–7.7)
Neutrophils Relative %: 63 %
Platelets: 262 10*3/uL (ref 150–400)
RBC: 4.51 MIL/uL (ref 3.87–5.11)
RDW: 14.3 % (ref 11.5–15.5)
WBC: 8.5 10*3/uL (ref 4.0–10.5)
nRBC: 0 % (ref 0.0–0.2)

## 2021-03-19 LAB — TROPONIN I (HIGH SENSITIVITY): Troponin I (High Sensitivity): 3 ng/L (ref <18)

## 2021-03-19 MED ORDER — TIZANIDINE HCL 4 MG PO TABS: 4.0000 mg | ORAL_TABLET | Freq: Four times a day (QID) | ORAL | 0 refills | Status: DC | PRN

## 2021-03-19 MED ORDER — OXYCODONE-ACETAMINOPHEN 5-325 MG PO TABS
1.0000 | ORAL_TABLET | Freq: Once | ORAL | Status: AC
  Administered 2021-03-19: 1 via ORAL
  Filled 2021-03-19: qty 1

## 2021-03-19 NOTE — ED Triage Notes (Addendum)
Per EMS, patient from home, c/o nerve stimulation throughout body. Has nerve stimulator in her back that she had for back pain, however that has not been on in 2.5 years. States "nerves feel like they are jumping out of body." Ambulatory with cane.

## 2021-03-19 NOTE — ED Provider Triage Note (Signed)
Emergency Medicine Provider Triage Evaluation Note  Martha Nolan , a 66 y.o. female  was evaluated in triage.  Patient reports that she has a spinal stimulator in her back but this has been disabled for several years.  She states that over the past 2 years she has had something in her body that stimulates her nerves.  This is causes her to have pain everywhere.  She experienced more severe than baseline, chest and abdominal pain today, prompting emergency department visit.  She describes the sensation as a "purring sensation".  No fevers, shortness of breath, vomiting, diarrhea, urinary symptoms.     Review of Systems  Positive: Body pain Negative: Fever  Physical Exam  BP 125/76    Pulse 88    Temp 98.7 F (37.1 C) (Oral)    Resp 20    SpO2 100%  Gen:   Awake, no distress   Resp:  Normal effort  MSK:   Moves extremities without difficulty  Other:  Abdomen soft and nontender, heart regular rate and rhythm  Medical Decision Making  Medically screening exam initiated at 6:37 PM.  Appropriate orders placed.  Martha Nolan was informed that the remainder of the evaluation will be completed by another provider, this initial triage assessment does not replace that evaluation, and the importance of remaining in the ED until their evaluation is complete.     Renne Crigler, PA-C 03/19/21 1840

## 2021-03-19 NOTE — ED Provider Notes (Signed)
[facility] DEPT Provider Note   CSN: [id number] Arrival date & time: 03/19/21  1817     History  Chief Complaint  Patient presents with   Generalized Body Aches    Martha Nolan is a 66 y.o. female.  Patient presents with intermittent spasming episodes.  She states she has had this for the past 2 years but they wax and wane in intensity.  She states it started after a spinal cord stimulator was placed.  She feels like the cord stimulator is always on even when she is not using it and has it off.  She spoke with her pain specialist about this finding without any significant improvement.  Symptoms continue to wax and wane over the last several years.  She had an episode today that appeared more pronounced on longer than normal and she presents to the ER.  She describes it as a "burning sensation" diffusely across her body.  Currently upon arrival to the ER she states that symptoms have improved significantly.  No fevers, no cough, no new weakness, or numbness.      Home Medications Prior to Admission medications   Medication Sig Start Date End Date Taking? Authorizing Provider  tiZANidine (ZANAFLEX) 4 MG tablet Take 1 tablet (4 mg total) by mouth every 6 (six) hours as needed for up to 8 doses for muscle spasms. 03/19/21  Yes Juanda Luba, Greggory Brandy, MD  albuterol (PROVENTIL HFA;VENTOLIN HFA) 108 (90 BASE) MCG/ACT inhaler Inhale 1 puff into the lungs every 6 (six) hours as needed for wheezing or shortness of breath.    [provider]  ARIPiprazole (ABILIFY) 10 MG tablet Take 10 mg by mouth daily.    [provider]  aspirin 81 MG chewable tablet Chew by mouth daily.    [provider]  B Complex-C (SUPER B COMPLEX PO) Take by mouth.    [provider]  DULoxetine (CYMBALTA) 60 MG capsule Take 60 mg by mouth daily.    [provider]  esomeprazole (NEXIUM) 40 MG capsule Take 1 capsule (40 mg total) by mouth 2 (two) times  daily before a meal. 06/17/13 06/18/14  Zanard, Bernadene Bell, MD  furosemide (LASIX) 80 MG tablet Take 1/2 - 1 tab daily for fluid 06/17/13 06/18/14  Zanard, Bernadene Bell, MD  gabapentin (NEURONTIN) 600 MG tablet Take 600 mg by mouth 3 (three) times daily.    [provider]  irbesartan (AVAPRO) 150 MG tablet Take 1 tablet (150 mg total) by mouth daily. 06/17/13 06/18/14  Jonathon Resides, MD  metFORMIN (GLUCOPHAGE-XR) 500 MG 24 hr tablet Take 1 tablet (500 mg total) by mouth 2 (two) times daily. 06/17/13   Jonathon Resides, MD  Multiple Vitamin (MULTIVITAMIN) capsule Take 1 capsule by mouth daily.    [provider]  Multiple Vitamins-Minerals (MULTIVITAMIN WITH MINERALS) tablet Take 1 tablet by mouth daily.    [provider]  nitroGLYCERIN [facility]) 0.2 mg/hr patch Apply 1/4th patch to affected achilles, change daily 09/03/13   Hudnall, Sharyn Lull, MD  potassium chloride SA (K-DUR,KLOR-CON) 20 MEQ tablet Take 1 tablet (20 mEq total) by mouth 2 (two) times daily. 06/17/13 06/18/14  Jonathon Resides, MD  sennosides-docusate sodium (SENOKOT-S) 8.6-50 MG tablet Take 1 tablet by mouth daily.    [provider]      Allergies    Penicillins    Review of Systems   Review of Systems  Constitutional:  Negative for fever.  HENT:  Negative for  ear pain.   Eyes:  Negative for pain.  Respiratory:  Negative for cough.   Cardiovascular:  Negative for chest pain.  Gastrointestinal:  Negative for abdominal pain.  Genitourinary:  Negative for flank pain.  Musculoskeletal:  Positive for back pain.  Skin:  Negative for rash.  Neurological:  Negative for headaches.   Physical Exam Updated Vital Signs BP 119/73 (BP Location: Left Arm)    Pulse 84    Temp 98.7 F (37.1 C) (Oral)    Resp 20    SpO2 100%  Physical Exam Constitutional:      General: She is not in acute distress.    Appearance: Normal appearance.  HENT:     Head: Normocephalic.     Nose: Nose normal.  Eyes:     Extraocular  Movements: Extraocular movements intact.  Cardiovascular:     Rate and Rhythm: Normal rate.  Pulmonary:     Effort: Pulmonary effort is normal.  Musculoskeletal:        General: Normal range of motion.     Cervical back: Normal range of motion.     Comments: No C or T or L-spine midline step-offs or tenderness noted.  Neurological:     General: No focal deficit present.     Mental Status: She is alert. Mental status is at baseline.     Comments: 5/5 strength all extremities normal gait able to stand without pain or discomfort able to transfer from sitting to laying down to standing with minimal effort.    ED Results / Procedures / Treatments   Labs (all labs ordered are listed, but only abnormal results are displayed) Labs Reviewed  COMPREHENSIVE METABOLIC PANEL - Abnormal; Notable for the following components:      Result Value   Glucose, Bld 110 (*)    Creatinine, Ser 1.25 (*)    GFR, Estimated 48 (*)    All other components within normal limits  CBC WITH DIFFERENTIAL/PLATELET  URINALYSIS, ROUTINE W REFLEX MICROSCOPIC  TROPONIN I (HIGH SENSITIVITY)    EKG EKG Interpretation  Date/Time:  Sunday March 19 2021 18:51:53 EST Ventricular Rate:  89 PR Interval:  158 QRS Duration: 94 QT Interval:  351 QTC Calculation: 427 R Axis:   -43 Text Interpretation: Sinus rhythm Left axis deviation Low voltage, precordial leads Consider anterior infarct Confirmed by Thamas Jaegers ([id number]) on 03/19/2021 10:08:29 PM  Radiology DG Chest 2 View  Result Date: 03/19/2021 CLINICAL DATA:  Chest pain EXAM: CHEST - 2 VIEW COMPARISON:  02/27/2021 FINDINGS: The heart size and mediastinal contours are within normal limits. Bandlike opacity within the periphery of the left lower lobe, likely atelectasis. Lungs appear otherwise clear. No pleural effusion or pneumothorax. Thoracic spinal stimulator leads are present. The visualized skeletal structures are unremarkable. IMPRESSION: Band-like opacity  within the periphery of the left lower lobe, likely atelectasis. Otherwise, no acute cardiopulmonary findings. Electronically Signed   By: Davina Poke D.O.   On: 03/19/2021 18:50    Procedures Procedures    Medications Ordered in ED Medications  oxyCODONE-acetaminophen (PERCOCET/ROXICET) 5-325 MG per tablet 1 tablet (1 tablet Oral Given 03/19/21 2211)    ED Course/ Medical Decision Making/ A&P                           Medical Decision Making Amount and/or Complexity of Data Reviewed Labs: ordered. Radiology: ordered.  Risk Prescription drug management.   Chart review shows outpatient visit February 22, 2021 with  her surgeons.  Work-up today is unremarkable labs are within normal limits.  Chest x-ray is unremarkable.  EKG shows sinus rhythm no ST elevations depressions no T wave inversions noted.  Patient appears comfortable at this time she states the burning sensation has largely improved.  Given Percocet here for her symptoms.  Advise continued outpatient follow-up with her pain specialist this week.  Advised return if she has new numbness weakness worsening pain fevers or any additional concerns.         Final Clinical Impression(s) / ED Diagnoses Final diagnoses:  Body aches    Rx / DC Orders ED Discharge Orders          Ordered    tiZANidine (ZANAFLEX) 4 MG tablet  Every 6 hours PRN        03/19/21 2249              Luna Fuse, MD 03/19/21 2249

## 2021-03-19 NOTE — Discharge Instructions (Signed)
Follow-up with your pain specialist this week.  Return if you have new fevers new numbness weakness or any additional concerns.

## 2021-03-22 ENCOUNTER — Observation Stay (HOSPITAL_COMMUNITY): Payer: Medicare Other

## 2021-03-22 ENCOUNTER — Emergency Department (HOSPITAL_COMMUNITY): Payer: Medicare Other

## 2021-03-22 ENCOUNTER — Encounter (HOSPITAL_COMMUNITY): Payer: Self-pay

## 2021-03-22 ENCOUNTER — Inpatient Hospital Stay (HOSPITAL_COMMUNITY)
Admission: EM | Admit: 2021-03-22 | Discharge: 2021-03-24 | DRG: 683 | Disposition: A | Discharge status: Home / Self Care | Payer: Medicare Other | Attending: Internal Medicine | Admitting: Internal Medicine

## 2021-03-22 ENCOUNTER — Other Ambulatory Visit: Payer: Self-pay

## 2021-03-22 DIAGNOSIS — Z20822 Contact with and (suspected) exposure to covid-19: Secondary | ICD-10-CM | POA: Diagnosis present

## 2021-03-22 DIAGNOSIS — G4733 Obstructive sleep apnea (adult) (pediatric): Secondary | ICD-10-CM | POA: Diagnosis present

## 2021-03-22 DIAGNOSIS — Z7984 Long term (current) use of oral hypoglycemic drugs: Secondary | ICD-10-CM

## 2021-03-22 DIAGNOSIS — Z6841 Body Mass Index (BMI) 40.0 and over, adult: Secondary | ICD-10-CM

## 2021-03-22 DIAGNOSIS — J45909 Unspecified asthma, uncomplicated: Secondary | ICD-10-CM | POA: Diagnosis present

## 2021-03-22 DIAGNOSIS — I959 Hypotension, unspecified: Secondary | ICD-10-CM

## 2021-03-22 DIAGNOSIS — Z87891 Personal history of nicotine dependence: Secondary | ICD-10-CM

## 2021-03-22 DIAGNOSIS — Z7982 Long term (current) use of aspirin: Secondary | ICD-10-CM

## 2021-03-22 DIAGNOSIS — N1832 Chronic kidney disease, stage 3b: Secondary | ICD-10-CM | POA: Diagnosis present

## 2021-03-22 DIAGNOSIS — N183 Chronic kidney disease, stage 3 unspecified: Secondary | ICD-10-CM

## 2021-03-22 DIAGNOSIS — R001 Bradycardia, unspecified: Secondary | ICD-10-CM | POA: Diagnosis present

## 2021-03-22 DIAGNOSIS — M4800 Spinal stenosis, site unspecified: Secondary | ICD-10-CM | POA: Diagnosis present

## 2021-03-22 DIAGNOSIS — K219 Gastro-esophageal reflux disease without esophagitis: Secondary | ICD-10-CM | POA: Diagnosis present

## 2021-03-22 DIAGNOSIS — N179 Acute kidney failure, unspecified: Secondary | ICD-10-CM | POA: Diagnosis not present

## 2021-03-22 DIAGNOSIS — Z833 Family history of diabetes mellitus: Secondary | ICD-10-CM

## 2021-03-22 DIAGNOSIS — Z79899 Other long term (current) drug therapy: Secondary | ICD-10-CM

## 2021-03-22 DIAGNOSIS — E119 Type 2 diabetes mellitus without complications: Secondary | ICD-10-CM

## 2021-03-22 DIAGNOSIS — M109 Gout, unspecified: Secondary | ICD-10-CM | POA: Diagnosis present

## 2021-03-22 DIAGNOSIS — F32A Depression, unspecified: Secondary | ICD-10-CM | POA: Diagnosis present

## 2021-03-22 DIAGNOSIS — T465X5A Adverse effect of other antihypertensive drugs, initial encounter: Secondary | ICD-10-CM | POA: Diagnosis present

## 2021-03-22 DIAGNOSIS — I129 Hypertensive chronic kidney disease with stage 1 through stage 4 chronic kidney disease, or unspecified chronic kidney disease: Secondary | ICD-10-CM | POA: Diagnosis present

## 2021-03-22 DIAGNOSIS — Z720 Tobacco use: Secondary | ICD-10-CM

## 2021-03-22 DIAGNOSIS — G8929 Other chronic pain: Secondary | ICD-10-CM | POA: Diagnosis present

## 2021-03-22 DIAGNOSIS — I952 Hypotension due to drugs: Secondary | ICD-10-CM | POA: Diagnosis present

## 2021-03-22 DIAGNOSIS — E785 Hyperlipidemia, unspecified: Secondary | ICD-10-CM | POA: Diagnosis present

## 2021-03-22 DIAGNOSIS — Z8249 Family history of ischemic heart disease and other diseases of the circulatory system: Secondary | ICD-10-CM

## 2021-03-22 DIAGNOSIS — F419 Anxiety disorder, unspecified: Secondary | ICD-10-CM | POA: Diagnosis present

## 2021-03-22 DIAGNOSIS — E1122 Type 2 diabetes mellitus with diabetic chronic kidney disease: Secondary | ICD-10-CM | POA: Diagnosis present

## 2021-03-22 DIAGNOSIS — Z880 Allergy status to penicillin: Secondary | ICD-10-CM

## 2021-03-22 LAB — RESP PANEL BY RT-PCR (FLU A&B, COVID) ARPGX2
Influenza A by PCR: NEGATIVE
Influenza B by PCR: NEGATIVE
SARS Coronavirus 2 by RT PCR: NEGATIVE

## 2021-03-22 LAB — URINALYSIS, ROUTINE W REFLEX MICROSCOPIC
Bilirubin Urine: NEGATIVE
Glucose, UA: NEGATIVE mg/dL
Hgb urine dipstick: NEGATIVE
Ketones, ur: NEGATIVE mg/dL
Leukocytes,Ua: NEGATIVE
Nitrite: NEGATIVE
Protein, ur: NEGATIVE mg/dL
Specific Gravity, Urine: 1.012 (ref 1.005–1.030)
pH: 5 (ref 5.0–8.0)

## 2021-03-22 LAB — BASIC METABOLIC PANEL
Anion gap: 8 (ref 5–15)
BUN: 24 mg/dL — ABNORMAL HIGH (ref 8–23)
CO2: 26 mmol/L (ref 22–32)
Calcium: 9 mg/dL (ref 8.9–10.3)
Chloride: 102 mmol/L (ref 98–111)
Creatinine, Ser: 2.18 mg/dL — ABNORMAL HIGH (ref 0.44–1.00)
GFR, Estimated: 25 mL/min — ABNORMAL LOW (ref >60)
Glucose, Bld: 140 mg/dL — ABNORMAL HIGH (ref 70–99)
Potassium: 4.2 mmol/L (ref 3.5–5.1)
Sodium: 136 mmol/L (ref 135–145)

## 2021-03-22 LAB — CBC
HCT: 36.7 % (ref 36.0–46.0)
Hemoglobin: 11.8 g/dL — ABNORMAL LOW (ref 12.0–15.0)
MCH: 31.4 pg (ref 26.0–34.0)
MCHC: 32.2 g/dL (ref 30.0–36.0)
MCV: 97.6 fL (ref 80.0–100.0)
Platelets: 244 10*3/uL (ref 150–400)
RBC: 3.76 MIL/uL — ABNORMAL LOW (ref 3.87–5.11)
RDW: 14.4 % (ref 11.5–15.5)
WBC: 7.5 10*3/uL (ref 4.0–10.5)
nRBC: 0 % (ref 0.0–0.2)

## 2021-03-22 LAB — POC OCCULT BLOOD, ED: Fecal Occult Bld: NEGATIVE

## 2021-03-22 LAB — LACTIC ACID, PLASMA
Lactic Acid, Venous: 1.4 mmol/L (ref 0.5–1.9)
Lactic Acid, Venous: 1.5 mmol/L (ref 0.5–1.9)

## 2021-03-22 LAB — TSH: TSH: 2.025 u[IU]/mL (ref 0.350–4.500)

## 2021-03-22 LAB — CBG MONITORING, ED
Glucose-Capillary: 123 mg/dL — ABNORMAL HIGH (ref 70–99)
Glucose-Capillary: 110 mg/dL — ABNORMAL HIGH (ref 70–99)
Glucose-Capillary: 95 mg/dL (ref 70–99)

## 2021-03-22 LAB — CORTISOL: Cortisol, Plasma: 80 ug/dL

## 2021-03-22 LAB — TROPONIN I (HIGH SENSITIVITY): Troponin I (High Sensitivity): 3 ng/L (ref <18)

## 2021-03-22 MED ORDER — ALBUTEROL SULFATE HFA 108 (90 BASE) MCG/ACT IN AERS: 1.0000 | INHALATION_SPRAY | Freq: Four times a day (QID) | RESPIRATORY_TRACT | Status: DC | PRN

## 2021-03-22 MED ORDER — ACETAMINOPHEN 325 MG PO TABS
650.0000 mg | ORAL_TABLET | Freq: Four times a day (QID) | ORAL | Status: DC | PRN
  Administered 2021-03-24: 650 mg via ORAL
  Filled 2021-03-22 (×2): qty 2

## 2021-03-22 MED ORDER — FLUTICASONE FUROATE-VILANTEROL 200-25 MCG/ACT IN AEPB
1.0000 | INHALATION_SPRAY | Freq: Every day | RESPIRATORY_TRACT | Status: DC
  Administered 2021-03-23 – 2021-03-24 (×2): 1 via RESPIRATORY_TRACT
  Filled 2021-03-22: qty 28

## 2021-03-22 MED ORDER — ASPIRIN 81 MG PO CHEW
81.0000 mg | CHEWABLE_TABLET | Freq: Every day | ORAL | Status: DC
  Administered 2021-03-22: 81 mg via ORAL
  Filled 2021-03-22 (×2): qty 1

## 2021-03-22 MED ORDER — ONDANSETRON HCL 4 MG/2ML IJ SOLN: 4.0000 mg | Freq: Four times a day (QID) | INTRAMUSCULAR | Status: DC | PRN

## 2021-03-22 MED ORDER — VANCOMYCIN HCL 1250 MG/250ML IV SOLN: 1250.0000 mg | INTRAVENOUS | Status: DC

## 2021-03-22 MED ORDER — FAMOTIDINE 20 MG PO TABS
40.0000 mg | ORAL_TABLET | Freq: Every day | ORAL | Status: DC
  Administered 2021-03-22 – 2021-03-23 (×2): 40 mg via ORAL
  Filled 2021-03-22 (×2): qty 2

## 2021-03-22 MED ORDER — VANCOMYCIN HCL IN DEXTROSE 1-5 GM/200ML-% IV SOLN
1000.0000 mg | Freq: Once | INTRAVENOUS | Status: AC
  Administered 2021-03-22: 1000 mg via INTRAVENOUS
  Filled 2021-03-22: qty 200

## 2021-03-22 MED ORDER — SODIUM CHLORIDE 0.9 % IV SOLN: INTRAVENOUS | Status: DC

## 2021-03-22 MED ORDER — AZELASTINE HCL 0.1 % NA SOLN
2.0000 | Freq: Two times a day (BID) | NASAL | Status: DC
  Administered 2021-03-22 – 2021-03-24 (×4): 2 via NASAL
  Filled 2021-03-22: qty 30

## 2021-03-22 MED ORDER — ALBUTEROL SULFATE (2.5 MG/3ML) 0.083% IN NEBU
2.5000 mg | INHALATION_SOLUTION | Freq: Four times a day (QID) | RESPIRATORY_TRACT | Status: DC | PRN
  Administered 2021-03-22: 2.5 mg via RESPIRATORY_TRACT
  Filled 2021-03-22: qty 3

## 2021-03-22 MED ORDER — SODIUM CHLORIDE 0.9 % IV SOLN
2.0000 g | Freq: Two times a day (BID) | INTRAVENOUS | Status: DC
  Administered 2021-03-22: 2 g via INTRAVENOUS
  Filled 2021-03-22 (×2): qty 2

## 2021-03-22 MED ORDER — BUPRENORPHINE HCL 450 MCG BU FILM: 450.0000 ug | ORAL_FILM | Freq: Two times a day (BID) | BUCCAL | Status: DC

## 2021-03-22 MED ORDER — LORATADINE 10 MG PO TABS
10.0000 mg | ORAL_TABLET | Freq: Every day | ORAL | Status: DC
  Administered 2021-03-22 – 2021-03-24 (×3): 10 mg via ORAL
  Filled 2021-03-22 (×3): qty 1

## 2021-03-22 MED ORDER — GABAPENTIN 600 MG PO TABS: 300.0000 mg | ORAL_TABLET | Freq: Three times a day (TID) | ORAL | Status: DC

## 2021-03-22 MED ORDER — FOLIC ACID 1 MG PO TABS
1.0000 mg | ORAL_TABLET | Freq: Every day | ORAL | Status: DC
  Administered 2021-03-22 – 2021-03-24 (×3): 1 mg via ORAL
  Filled 2021-03-22 (×3): qty 1

## 2021-03-22 MED ORDER — ROSUVASTATIN CALCIUM 20 MG PO TABS
20.0000 mg | ORAL_TABLET | Freq: Every day | ORAL | Status: DC
  Administered 2021-03-22 – 2021-03-23 (×2): 20 mg via ORAL
  Filled 2021-03-22 (×2): qty 1

## 2021-03-22 MED ORDER — TIZANIDINE HCL 4 MG PO TABS
2.0000 mg | ORAL_TABLET | Freq: Three times a day (TID) | ORAL | Status: DC | PRN
  Administered 2021-03-23: 2 mg via ORAL
  Filled 2021-03-22: qty 1

## 2021-03-22 MED ORDER — PANTOPRAZOLE SODIUM 40 MG PO TBEC
40.0000 mg | DELAYED_RELEASE_TABLET | Freq: Two times a day (BID) | ORAL | Status: DC
  Administered 2021-03-22 – 2021-03-24 (×4): 40 mg via ORAL
  Filled 2021-03-22 (×4): qty 1

## 2021-03-22 MED ORDER — INSULIN ASPART 100 UNIT/ML IJ SOLN
0.0000 [IU] | Freq: Three times a day (TID) | INTRAMUSCULAR | Status: DC
  Administered 2021-03-23 – 2021-03-24 (×3): 2 [IU] via SUBCUTANEOUS
  Filled 2021-03-22: qty 0.15

## 2021-03-22 MED ORDER — FENOFIBRIC ACID 135 MG PO CPDR: 135.0000 mg | DELAYED_RELEASE_CAPSULE | Freq: Every day | ORAL | Status: DC

## 2021-03-22 MED ORDER — FENOFIBRATE 160 MG PO TABS
160.0000 mg | ORAL_TABLET | Freq: Every day | ORAL | Status: DC
  Administered 2021-03-23 – 2021-03-24 (×2): 160 mg via ORAL
  Filled 2021-03-22 (×2): qty 1

## 2021-03-22 MED ORDER — ONDANSETRON HCL 4 MG PO TABS: 4.0000 mg | ORAL_TABLET | Freq: Four times a day (QID) | ORAL | Status: DC | PRN

## 2021-03-22 MED ORDER — SODIUM CHLORIDE 0.9 % IV BOLUS
1000.0000 mL | Freq: Once | INTRAVENOUS | Status: AC
Start: 2021-03-22 — End: 2021-03-22
  Administered 2021-03-22: 1000 mL via INTRAVENOUS

## 2021-03-22 MED ORDER — BUPROPION HCL ER (XL) 150 MG PO TB24
150.0000 mg | ORAL_TABLET | Freq: Every day | ORAL | Status: DC
  Administered 2021-03-22 – 2021-03-24 (×3): 150 mg via ORAL
  Filled 2021-03-22 (×3): qty 1

## 2021-03-22 MED ORDER — TRAZODONE HCL 50 MG PO TABS
25.0000 mg | ORAL_TABLET | Freq: Every evening | ORAL | Status: DC | PRN
  Administered 2021-03-23: 25 mg via ORAL
  Filled 2021-03-22: qty 1

## 2021-03-22 MED ORDER — PREGABALIN 50 MG PO CAPS
50.0000 mg | ORAL_CAPSULE | Freq: Three times a day (TID) | ORAL | Status: DC
  Administered 2021-03-22 – 2021-03-24 (×5): 50 mg via ORAL
  Filled 2021-03-22 (×5): qty 1

## 2021-03-22 MED ORDER — SODIUM CHLORIDE 0.9 % IV BOLUS
1000.0000 mL | Freq: Once | INTRAVENOUS | Status: AC
  Administered 2021-03-22: 1000 mL via INTRAVENOUS

## 2021-03-22 MED ORDER — ACETAMINOPHEN 650 MG RE SUPP
650.0000 mg | Freq: Four times a day (QID) | RECTAL | Status: DC | PRN
Start: 2021-03-22 — End: 2021-03-24

## 2021-03-22 MED ORDER — DULOXETINE HCL 60 MG PO CPEP
60.0000 mg | ORAL_CAPSULE | Freq: Every day | ORAL | Status: DC
  Administered 2021-03-22 – 2021-03-24 (×3): 60 mg via ORAL
  Filled 2021-03-22 (×2): qty 2
  Filled 2021-03-22: qty 1

## 2021-03-22 MED ORDER — HYDROCORTISONE SOD SUC (PF) 100 MG IJ SOLR
100.0000 mg | Freq: Once | INTRAMUSCULAR | Status: AC
  Administered 2021-03-22: 100 mg via INTRAVENOUS
  Filled 2021-03-22: qty 2

## 2021-03-22 MED ORDER — SODIUM CHLORIDE 0.9 % IV SOLN
2.0000 g | Freq: Once | INTRAVENOUS | Status: AC
  Administered 2021-03-22: 2 g via INTRAVENOUS
  Filled 2021-03-22: qty 2

## 2021-03-22 MED ORDER — ENOXAPARIN SODIUM 60 MG/0.6ML IJ SOSY
55.0000 mg | PREFILLED_SYRINGE | INTRAMUSCULAR | Status: DC
  Administered 2021-03-22 – 2021-03-23 (×2): 55 mg via SUBCUTANEOUS
  Filled 2021-03-22 (×2): qty 0.55
  Filled 2021-03-22: qty 0.6

## 2021-03-22 MED ORDER — POTASSIUM CHLORIDE CRYS ER 20 MEQ PO TBCR
20.0000 meq | EXTENDED_RELEASE_TABLET | Freq: Two times a day (BID) | ORAL | Status: DC
  Administered 2021-03-22 – 2021-03-24 (×4): 20 meq via ORAL
  Filled 2021-03-22 (×4): qty 1

## 2021-03-22 NOTE — Progress Notes (Signed)
Pharmacy Antibiotic Note  Martha Nolan is a 65 y.o. female admitted on 03/22/2021 with  hypotension .  Pharmacy has been consulted for vancomycin and cefepime dosing for sepsis.  Pt has PCN Allergy documented, but received a dose of cefepime this morning.  Today, 03/22/21 SCr 2.18 - AKI. Baseline SCr ~1.2-1.5.  CrCl = 34 mL/min. Calculated using Adjusted body weight based on documented TBW of 120 kg on 02/16/21 office visit. Ht/wt order entered overnight, but nothing documented WBC WNL  Plan: Cefepime 2 g IV q12h PT received vancomycin 1000 mg IV one time dose this morning. Will give another 1000 mg dose for a total loading dose of 2000 mg  Maintenance vancomycin dose of 1250 mg IV q48h for estimated AUC of 431 Goal vancomycin AUC 400-550. Check levels at steady state as needed Monitor renal function and culture data     Temp (24hrs), Avg:97.1 F (36.2 C), Min:96.7 F (35.9 C), Max:97.4 F (36.3 C)  Recent Labs  Lab 03/19/21 1916 03/22/21 0256 03/22/21 0545 03/22/21 0955  WBC 8.5 7.5  --   --   CREATININE 1.25* 2.18*  --   --   LATICACIDVEN  --   --  1.4 1.5    CrCl cannot be calculated (Unknown ideal weight.).    Allergies  Allergen Reactions   Penicillins Hives    Antimicrobials this admission: vancomycin 2/15 >>  cefepime 2/15 >>   Dose adjustments this admission:  Microbiology results: 2/15 BCx:   Lenis Noon, PharmD 03/22/2021 12:22 PM

## 2021-03-22 NOTE — H&P (Addendum)
History and Physical    PatientArlyss Nolan HDQ:[id number] DOB: 01/26/1956 DOA: 03/22/2021 DOS: the patient was seen and examined on 03/22/2021 PCP: Martha Aldo, NP  Patient coming from: Home  Chief Complaint:  Chief Complaint  Patient presents with   Hypotension    HPI: Martha Nolan is a 66 y.o. female with medical history significant of HTN, DM2, OSA, chronic back pain. Presenting with lightheadedness and mental fog starting around 1030p last night. She decided to start checking her blood pressures. Initially she saw her SBP was in the 90s. As she continued to check, they dropped into the 60s. She didn't have any chest pain or palpitations. She started to feel dizzy, so she called EMS. She denies any other aggravating or alleviating factors.    Review of Systems: As mentioned in the history of present illness. All other systems reviewed and are negative. Past Medical History:  Diagnosis Date   Anxiety    Asthma    Barrett's esophagus    Dr. Nedra Hai (EGD - 03/2013) Q 3 years    Chronic back pain    Chronic pain    Low Back L4-S1   Depression    Diabetes mellitus without complication (HCC)    GERD (gastroesophageal reflux disease)    Hypertension    Internal hemorrhoids    Dr. Conley Rolls (Colon   Migraine    Sleep apnea    Spinal stenosis    Past Surgical History:  Procedure Laterality Date   CARPAL TUNNEL RELEASE     CESAREAN SECTION     CESAREAN SECTION     CHOLECYSTECTOMY     DILATION AND CURETTAGE OF UTERUS  11/05/2012   ULNAR NERVE REPAIR     Social History:  reports that she quit smoking about 8 years ago. Her smoking use included cigarettes and e-cigarettes. She has a 17.50 pack-year smoking history. She does not have any smokeless tobacco history on file. She reports that she does not drink alcohol and does not use drugs.  Allergies  Allergen Reactions   Penicillins Hives    Family History  Problem Relation Age of Onset   Hypertension Mother    Hypertension  Father    Hypertension Maternal Grandmother    Diabetes Maternal Grandmother    Hypertension Maternal Grandfather    Cervical cancer Paternal Grandmother    Hypertension Paternal Grandmother    Hypertension Paternal Grandfather    Diabetes Paternal Uncle     Prior to Admission medications   Medication Sig Start Date End Date Taking? Authorizing Provider  albuterol (PROVENTIL HFA;VENTOLIN HFA) 108 (90 BASE) MCG/ACT inhaler Inhale 1 puff into the lungs every 6 (six) hours as needed for wheezing or shortness of breath.    [provider]  ARIPiprazole (ABILIFY) 10 MG tablet Take 10 mg by mouth daily.    [provider]  aspirin 81 MG chewable tablet Chew by mouth daily.    [provider]  B Complex-C (SUPER B COMPLEX PO) Take by mouth.    [provider]  DULoxetine (CYMBALTA) 60 MG capsule Take 60 mg by mouth daily.    [provider]  esomeprazole (NEXIUM) 40 MG capsule Take 1 capsule (40 mg total) by mouth 2 (two) times daily before a meal. 06/17/13 06/18/14  Zanard, Hinton Dyer, MD  furosemide (LASIX) 80 MG tablet Take 1/2 - 1 tab daily for fluid 06/17/13 06/18/14  Zanard, Hinton Dyer, MD  gabapentin (NEURONTIN) 600 MG tablet Take 600 mg by mouth 3 (three)  times daily.    [provider]  irbesartan (AVAPRO) 150 MG tablet Take 1 tablet (150 mg total) by mouth daily. 06/17/13 06/18/14  Gillian Scarce, MD  metFORMIN (GLUCOPHAGE-XR) 500 MG 24 hr tablet Take 1 tablet (500 mg total) by mouth 2 (two) times daily. 06/17/13   Gillian Scarce, MD  Multiple Vitamin (MULTIVITAMIN) capsule Take 1 capsule by mouth daily.    [provider]  Multiple Vitamins-Minerals (MULTIVITAMIN WITH MINERALS) tablet Take 1 tablet by mouth daily.    [provider]  nitroGLYCERIN [facility]) 0.2 mg/hr patch Apply 1/4th patch to affected achilles, change daily 09/03/13   Hudnall, Azucena Fallen, MD  potassium chloride SA (K-DUR,KLOR-CON) 20 MEQ tablet Take 1 tablet (20  mEq total) by mouth 2 (two) times daily. 06/17/13 06/18/14  Gillian Scarce, MD  sennosides-docusate sodium (SENOKOT-S) 8.6-50 MG tablet Take 1 tablet by mouth daily.    [provider]  tiZANidine (ZANAFLEX) 4 MG tablet Take 1 tablet (4 mg total) by mouth every 6 (six) hours as needed for up to 8 doses for muscle spasms. 03/19/21   Cheryll Cockayne, MD    Physical Exam: Vitals:   03/22/21 0830 03/22/21 0915 03/22/21 1000 03/22/21 1045  BP: (!) 87/53 (!) 99/56 (!) 85/76 (!) 102/59  Pulse: (!) 58 62 69 78  Resp: 14 11 13 13   Temp:      TempSrc:      SpO2: 100% 100% 97% 98%   General: 66 y.o. female resting in bed in NAD Eyes: PERRL, normal sclera ENMT: Nares patent w/o discharge, orophaynx clear, dentition normal, ears w/o discharge/lesions/ulcers Neck: Supple, trachea midline Cardiovascular: RRR, +S1, S2, no m/g/r, equal pulses throughout Respiratory: CTABL, no w/r/r, normal WOB GI: BS+, NDNT, no masses noted, no organomegaly noted MSK: No e/c/c Neuro: A&O x 3, no focal deficits Psyc: Appropriate interaction and affect, calm/cooperative  Data Reviewed:  Scr 2.18  Glucose 140 Hgb 11.8  CXR left base atelectasis vs scarring EKG sinus no st elevations  Assessment and Plan: No notes have been filed under this hospital service. Service: Hospitalist AKI on CKD3b     - place in obs, tele     - continue fluids     - check renal 09-23-1999, AM labs     - says she's been taking her BP meds correctly, UOP has been ok at home; UA is negative here     - ?from pain meds?     Hypotension Hx of HTN     - hold home BP meds     - continue fluids, MAPs are improved     - UA is negative, Bld ordered     - w/u thus far is negative; was started on broad spec abx; check procal, if negative, d/c abx     - she was also given solucortef; no history of adrenal crisis; hold further doses for now     - can check cort-stim in AM  DM2     - DM diet, A1c, SSI, glucose checks  Chronic pain     -  confirm home regimen, resume as tolerated  Tobacco abuse     - counsel against further use   HLD     - resume home regimen  Advance Care Planning: CODE Status: FULL  Consults: PCCM called by EDP  Family Communication: None at bedside  Severity of Illness: The appropriate patient status for this patient is OBSERVATION. Observation status is judged to be  reasonable and necessary in order to provide the required intensity of service to ensure the patient's safety. The patient's presenting symptoms, physical exam findings, and initial radiographic and laboratory data in the context of their medical condition is felt to place them at decreased risk for further clinical deterioration. Furthermore, it is anticipated that the patient will be medically stable for discharge from the hospital within 2 midnights of admission.   Author: Teddy Spike, DO 03/22/2021 11:08 AM  For on call review [email or web address].

## 2021-03-22 NOTE — ED Triage Notes (Signed)
Biba from home after patient states she started feeling "weird" and took her BP and it was 95/50- EMS states her BP was 60/40 and gave her a bolus. Reports dizziness but no other complaints. Takes losartan at home, last dose 0830am 2/14.

## 2021-03-22 NOTE — ED Notes (Signed)
Ambulatory to bathroom with cane. No signs of distress. Gait steady.

## 2021-03-22 NOTE — ED Provider Notes (Addendum)
Patient seen after prior ED provider.  Patient remains minimally symptomatic.  Pressures remained in the 80s systolic.  MAP is hovering around 60.  Patient is receiving fourth liter of volume.  Critical care is aware case and will consult.  0945 Critical care feels that hospitalist admit is appropriate. Dr. Ronaldo Miyamoto aware of case.     Wynetta Fines, MD 03/22/21 7106    Wynetta Fines, MD 03/22/21 [phone]

## 2021-03-22 NOTE — Consult Note (Addendum)
NAME:  Martha Nolan, MRN:  [id number], DOB:  [birth year], LOS: 0 ADMISSION DATE:  03/22/2021 CONSULTATION DATE:  03/22/2021 REFERRING MD:  Martha Nolan - EDP CHIEF COMPLAINT:  Hypotension   History of Present Illness:  66 year old woman who presented to [facility] ED 2/15 via EMS for general malaise, dizziness. Found to be hypotensive, per EMS 60s/40s. SBPs 70s in ED (reports 90s at home). Reports fair PO intake at home. Of note, takes losartan for HTN. PMHx significant for HTN, T2DM, OSA, GERD/Barrett's esophagus, chronic back pain (nerve stimulator in place), migraines, anxiety/depression.  On ED arrival, patient was hypotensive with SBPs 60s-70s, mildly bradycardic in the high. Received several fluid boluses with marginal improvement in pressures. Labs were WNL with the exception of AKI (Cr 2.18, initial AKI noted 3 days ago with Cr 1.25, baseline 1.0). Empiric cefepime/vancomycin was initiated. Cultures were obtained.  PCCM consulted for persistent hypotension with possible need for peripheral pressor initiation.  Pertinent Medical History:   Past Medical History:  Diagnosis Date   Anxiety    Asthma    Barrett's esophagus    Dr. Nedra Nolan (EGD - 03/2013) Q 3 years    Chronic back pain    Chronic pain    Low Back L4-S1   Depression    Diabetes mellitus without complication (HCC)    GERD (gastroesophageal reflux disease)    Hypertension    Internal hemorrhoids    Dr. Conley Nolan (Colon   Migraine    Sleep apnea    Spinal stenosis    Significant Hospital Events: Including procedures, antibiotic start and stop dates in addition to other pertinent events   2/15 - Presented via EMS for general malaise, "feeling unwell". SBPs 90s at home. EMS noting BP 60s/40s. Improved with fluid boluses. Mildly bradycardic. PCCM consulted for persistent hypotension.  Interim History / Subjective:  Generally feeling well, no focal symptoms Reports L hip pain which is more recent Chronic LBP Denies CP/SOB,  dizziness/lightheadedness, syncope Reports no urine output since ED arrival, no issues at home Appetite is ok Taking Losartan for BP at home  Objective:  Blood pressure (!) 87/53, pulse (!) 58, temperature (!) 96.7 F (35.9 C), temperature source Rectal, resp. rate 14, SpO2 100 %.       No intake or output data in the 24 hours ending 03/22/21 0900 There were no vitals filed for this visit.  Physical Examination: General: Middle-aged woman, non-toxic appearing, in NAD. HEENT: [place]/AT, anicteric sclera, PERRL, moist mucous membranes. Neuro: Awake, oriented x 4. Responds to verbal stimuli. Following commands consistently. Moves all 4 extremities spontaneously.  CV: RRR (rate 60s), no m/g/r. PULM: Breathing even and unlabored on RA. Lung fields CTAB anteriorly. GI: Obese, soft, nontender, nondistended. Normoactive bowel sounds. Extremities: Trace bilateral LE edema noted. Skin: Warm/dry, no rashes.  Resolved Hospital Problem List:    Assessment & Plan:  Martha Nolan is seen in consultation at the request of Dr. Rodena Nolan (EDP) for further evaluation and management of hypotension.  66 year old woman who presented to [facility] ED 2/15 via EMS for general malaise, dizziness. Found to be hypotensive, per EMS 60s/40s. SBPs 70s in ED (reports 90s at home). Patient does have a history of hypertension for which she takes losartan - query if the continued use of this medication in the setting of marginal PO intake has contributed to her AKI. Overall, hemodynamics appear to be improved with fluid resuscitation and pressors are not required at this juncture.  Hypotension, nonspecific - Recommend admission to SDU/floor for observation  under hospitalist service - Goal MAP > 65 - Ongoing fluid resuscitation to maintain MAP goal - Would consider peripheral pressor initiation if unable to maintain MAP with fluids alone - Trend CBC, LA  Acute kidney injury Likely prerenal/ATN in the setting of hypotension vs.  medication effect from losartan. - Trend BMP - Replete electrolytes as indicated - Monitor I&Os - Bladder scan if unable to void, r/o retention - Avoid nephrotoxic agents as able - Ensure adequate renal perfusion  Thank you for this consult. PCCM will continue to follow with you.  Best Practice: (right click and "Reselect all SmartList Selections" daily)   Per Primary Team  Labs:  CBC: Recent Labs  Lab 03/19/21 1916 03/22/21 0256  WBC 8.5 7.5  NEUTROABS 5.4  --   HGB 14.0 11.8*  HCT 43.3 36.7  MCV 96.0 97.6  PLT 262 244   Basic Metabolic Panel: Recent Labs  Lab 03/19/21 1916 03/22/21 0256  NA 138 136  K 3.9 4.2  CL 102 102  CO2 25 26  GLUCOSE 110* 140*  BUN 13 24*  CREATININE 1.25* 2.18*  CALCIUM 9.7 9.0   GFR: CrCl cannot be calculated (Unknown ideal weight.). Recent Labs  Lab 03/19/21 1916 03/22/21 0256 03/22/21 0545  WBC 8.5 7.5  --   LATICACIDVEN  --   --  1.4   Liver Function Tests: Recent Labs  Lab 03/19/21 1916  AST 40  ALT 31  ALKPHOS 50  BILITOT 0.4  PROT 6.9  ALBUMIN 3.9   No results for input(s): LIPASE, AMYLASE in the last 168 hours. No results for input(s): AMMONIA in the last 168 hours.  ABG: No results found for: PHART, PCO2ART, PO2ART, HCO3, TCO2, ACIDBASEDEF, O2SAT   Coagulation Profile: No results for input(s): INR, PROTIME in the last 168 hours.  Cardiac Enzymes: No results for input(s): CKTOTAL, CKMB, CKMBINDEX, TROPONINI in the last 168 hours.  HbA1C: Hemoglobin A1C  Date/Time Value Ref Range Status  06/17/2013 12:25 PM 6.3  Final   CBG: Recent Labs  Lab 03/22/21 0331  GLUCAP 123*   Review of Systems:   Review of systems completed with pertinent positives/negatives outlined in above HPI.  Past Medical History:  She,  has a past medical history of Anxiety, Asthma, Barrett's esophagus, Chronic back pain, Chronic pain, Depression, Diabetes mellitus without complication (HCC), GERD (gastroesophageal reflux  disease), Hypertension, Internal hemorrhoids, Migraine, Sleep apnea, and Spinal stenosis.   Surgical History:   Past Surgical History:  Procedure Laterality Date   CARPAL TUNNEL RELEASE     CESAREAN SECTION     CESAREAN SECTION     CHOLECYSTECTOMY     DILATION AND CURETTAGE OF UTERUS  11/05/2012   ULNAR NERVE REPAIR      Social History:   reports that she quit smoking about 8 years ago. Her smoking use included cigarettes and e-cigarettes. She has a 17.50 pack-year smoking history. She does not have any smokeless tobacco history on file. She reports that she does not drink alcohol and does not use drugs.   Family History:  Her family history includes Cervical cancer in her paternal grandmother; Diabetes in her maternal grandmother and paternal uncle; Hypertension in her father, maternal grandfather, maternal grandmother, mother, paternal grandfather, and paternal grandmother.   Allergies: Allergies  Allergen Reactions   Penicillins Hives    Home Medications: Prior to Admission medications   Medication Sig Start Date End Date Taking? Authorizing Provider  albuterol (PROVENTIL HFA;VENTOLIN HFA) 108 (90 BASE) MCG/ACT inhaler Inhale  1 puff into the lungs every 6 (six) hours as needed for wheezing or shortness of breath.    [provider]  ARIPiprazole (ABILIFY) 10 MG tablet Take 10 mg by mouth daily.    [provider]  aspirin 81 MG chewable tablet Chew by mouth daily.    [provider]  B Complex-C (SUPER B COMPLEX PO) Take by mouth.    [provider]  DULoxetine (CYMBALTA) 60 MG capsule Take 60 mg by mouth daily.    [provider]  esomeprazole (NEXIUM) 40 MG capsule Take 1 capsule (40 mg total) by mouth 2 (two) times daily before a meal. 06/17/13 06/18/14  Zanard, Hinton Dyerobyn K, MD  furosemide (LASIX) 80 MG tablet Take 1/2 - 1 tab daily for fluid 06/17/13 06/18/14  Zanard, Hinton Dyerobyn K, MD  gabapentin (NEURONTIN) 600 MG tablet Take 600 mg by mouth 3  (three) times daily.    [provider]  irbesartan (AVAPRO) 150 MG tablet Take 1 tablet (150 mg total) by mouth daily. 06/17/13 06/18/14  Gillian ScarceZanard, Robyn K, MD  metFORMIN (GLUCOPHAGE-XR) 500 MG 24 hr tablet Take 1 tablet (500 mg total) by mouth 2 (two) times daily. 06/17/13   Gillian ScarceZanard, Robyn K, MD  Multiple Vitamin (MULTIVITAMIN) capsule Take 1 capsule by mouth daily.    [provider]  Multiple Vitamins-Minerals (MULTIVITAMIN WITH MINERALS) tablet Take 1 tablet by mouth daily.    [provider]  nitroGLYCERIN [facility](MINITRAN) 0.2 mg/hr patch Apply 1/4th patch to affected achilles, change daily 09/03/13   Hudnall, Azucena FallenShane R, MD  potassium chloride SA (K-DUR,KLOR-CON) 20 MEQ tablet Take 1 tablet (20 mEq total) by mouth 2 (two) times daily. 06/17/13 06/18/14  Gillian ScarceZanard, Robyn K, MD  sennosides-docusate sodium (SENOKOT-S) 8.6-50 MG tablet Take 1 tablet by mouth daily.    [provider]  tiZANidine (ZANAFLEX) 4 MG tablet Take 1 tablet (4 mg total) by mouth every 6 (six) hours as needed for up to 8 doses for muscle spasms. 03/19/21   Cheryll CockayneHong, Joshua S, MD   Critical care time: N/A   Faythe GheeStephanie M Reese, PA-C [facility] 03/22/21 9:00 AM  Please see [email or web address] for pager details.  From 7A-7P if no response, please call [phone] After hours, please call [facility] [phone]   Pulmonary critical care attending:  This 66 year old female, past medical history of anxiety, Barrett's esophagus, chronic back pain, stimulator in place, diabetes, gastroesophageal reflux, hypertension.  Patient presented to the ER after feeling poorly over the past couple of days increased malaise fatigue.  Little urine output.  Patient found to be in renal failure and hypotensive.  Patient however responded well to fluid resuscitation.  From a blood pressure standpoint her blood pressure has improved.  She was continue to take her losartan at home.  Found to have new elevated serum  creatinine.  BP (!) 94/57    Pulse 83    Temp (!) 96.7 F (35.9 C) (Rectal)    Resp 15    SpO2 98%   General: Obese female resting in bed HEENT: Tracking appropriately Heart: Rate rhythm S1-S2 Lungs: Clear to auscultation bilaterally no crackles no wheeze Abdomen: Obese, soft  Labs: Reviewed Newly elevated serum creatinine Potassium normal  Assessment: Hypotension, likely medication related Acute kidney injury, likely prerenal versus effect from ARB.  Plan: Hold blood pressure medications Patient has had adequate fluid resuscitation Blood pressure has responded appropriately. Remains off any vasopressors at this time. Complete sepsis work-up however no current signs of  infection. I think patient is stable for admission to floor by hospitalist service at this time. She will need recheck BMP tomorrow.  Josephine Igo, DO [facility] 03/22/2021 12:02 PM

## 2021-03-22 NOTE — ED Notes (Signed)
Dinner provided.

## 2021-03-22 NOTE — ED Provider Notes (Signed)
[facility] DEPT Provider Note   CSN: [id number] Arrival date & time: 03/22/21  [id number]     History  Chief Complaint  Patient presents with   Hypotension    Martha Nolan is a 66 y.o. female.  HPI     This is a 66 year old female with a history of diabetes, hypertension who presents with low blood pressure.  Patient reports that she began to feel funny last night.  She took her blood pressure at home and it was 90 systolic.  She progressively noted lower blood pressure readings.  She reports dizziness and "feeling weird."  Denies any recent illnesses or fevers.  For EMS her blood pressure was 60/40.  She was given 500 cc bolus.  She does take losartan for blood pressure and last took yesterday morning.  Denies any significant dehydration.  Denies any nausea or vomiting.  No other change in medication.  Denies alcohol or drug use.  Home Medications Prior to Admission medications   Medication Sig Start Date End Date Taking? Authorizing Provider  albuterol (PROVENTIL HFA;VENTOLIN HFA) 108 (90 BASE) MCG/ACT inhaler Inhale 1 puff into the lungs every 6 (six) hours as needed for wheezing or shortness of breath.    [provider]  ARIPiprazole (ABILIFY) 10 MG tablet Take 10 mg by mouth daily.    [provider]  aspirin 81 MG chewable tablet Chew by mouth daily.    [provider]  B Complex-C (SUPER B COMPLEX PO) Take by mouth.    [provider]  DULoxetine (CYMBALTA) 60 MG capsule Take 60 mg by mouth daily.    [provider]  esomeprazole (NEXIUM) 40 MG capsule Take 1 capsule (40 mg total) by mouth 2 (two) times daily before a meal. 06/17/13 06/18/14  Zanard, Bernadene Bell, MD  furosemide (LASIX) 80 MG tablet Take 1/2 - 1 tab daily for fluid 06/17/13 06/18/14  Zanard, Bernadene Bell, MD  gabapentin (NEURONTIN) 600 MG tablet Take 600 mg by mouth 3 (three) times daily.    [provider]  irbesartan (AVAPRO) 150 MG tablet  Take 1 tablet (150 mg total) by mouth daily. 06/17/13 06/18/14  Jonathon Resides, MD  metFORMIN (GLUCOPHAGE-XR) 500 MG 24 hr tablet Take 1 tablet (500 mg total) by mouth 2 (two) times daily. 06/17/13   Jonathon Resides, MD  Multiple Vitamin (MULTIVITAMIN) capsule Take 1 capsule by mouth daily.    [provider]  Multiple Vitamins-Minerals (MULTIVITAMIN WITH MINERALS) tablet Take 1 tablet by mouth daily.    [provider]  nitroGLYCERIN [facility]) 0.2 mg/hr patch Apply 1/4th patch to affected achilles, change daily 09/03/13   Hudnall, Sharyn Lull, MD  potassium chloride SA (K-DUR,KLOR-CON) 20 MEQ tablet Take 1 tablet (20 mEq total) by mouth 2 (two) times daily. 06/17/13 06/18/14  Jonathon Resides, MD  sennosides-docusate sodium (SENOKOT-S) 8.6-50 MG tablet Take 1 tablet by mouth daily.    [provider]  tiZANidine (ZANAFLEX) 4 MG tablet Take 1 tablet (4 mg total) by mouth every 6 (six) hours as needed for up to 8 doses for muscle spasms. 03/19/21   Luna Fuse, MD      Allergies    Penicillins    Review of Systems   Review of Systems  Constitutional:  Negative for fever.  Neurological:  Positive for light-headedness.  All other systems reviewed and are negative.  Physical Exam Updated Vital Signs BP (!) 74/46    Pulse 61    Temp (!)  96.7 F (35.9 C) (Rectal)    Resp 15    SpO2 100%  Physical Exam Vitals and nursing note reviewed.  Constitutional:      Appearance: She is well-developed. She is obese. She is not ill-appearing.  HENT:     Head: Normocephalic and atraumatic.     Nose: Nose normal.     Mouth/Throat:     Mouth: Mucous membranes are moist.  Eyes:     Pupils: Pupils are equal, round, and reactive to light.  Cardiovascular:     Rate and Rhythm: Normal rate and regular rhythm.     Heart sounds: Normal heart sounds.  Pulmonary:     Effort: Pulmonary effort is normal. No respiratory distress.     Breath sounds: No wheezing.  Abdominal:     General:  Bowel sounds are normal.     Palpations: Abdomen is soft.  Musculoskeletal:     Cervical back: Neck supple.     Right lower leg: No edema.     Left lower leg: No edema.  Skin:    General: Skin is warm and dry.  Neurological:     Mental Status: She is alert and oriented to person, place, and time.  Psychiatric:        Mood and Affect: Mood normal.    ED Results / Procedures / Treatments   Labs (all labs ordered are listed, but only abnormal results are displayed) Labs Reviewed  BASIC METABOLIC PANEL - Abnormal; Notable for the following components:      Result Value   Glucose, Bld 140 (*)    BUN 24 (*)    Creatinine, Ser 2.18 (*)    GFR, Estimated 25 (*)    All other components within normal limits  CBC - Abnormal; Notable for the following components:   RBC 3.76 (*)    Hemoglobin 11.8 (*)    All other components within normal limits  CBG MONITORING, ED - Abnormal; Notable for the following components:   Glucose-Capillary 123 (*)    All other components within normal limits  RESP PANEL BY RT-PCR (FLU A&B, COVID) [id number]  CULTURE, BLOOD (ROUTINE X 2)  CULTURE, BLOOD (ROUTINE X 2)  LACTIC ACID, PLASMA  URINALYSIS, ROUTINE W REFLEX MICROSCOPIC  LACTIC ACID, PLASMA  TSH  CORTISOL  POC OCCULT BLOOD, ED  TROPONIN I (HIGH SENSITIVITY)    EKG EKG Interpretation  Date/Time:  Wednesday March 22 2021 03:19:26 EST Ventricular Rate:  54 PR Interval:  175 QRS Duration: 100 QT Interval:  446 QTC Calculation: 423 R Axis:   -4 Text Interpretation: Sinus rhythm Low voltage, extremity and precordial leads Confirmed by Thayer Jew [phone]) on 03/22/2021 3:23:18 AM  Radiology DG Chest Portable 1 View  Result Date: 03/22/2021 CLINICAL DATA:  Hypotension. EXAM: PORTABLE CHEST 1 VIEW COMPARISON:  03/19/2021 FINDINGS: Heart size and mediastinal contours are normal. Subsegmental atelectasis versus scarring noted in the left base. No pleural effusion, airspace consolidation or  interstitial edema. Dorsal column stimulator identified with tip terminating in the upper thoracic spine. IMPRESSION: Left base atelectasis versus scarring. Electronically Signed   By: Kerby Moors M.D.   On: 03/22/2021 06:35    Procedures .Critical Care Performed by: Merryl Hacker, MD Authorized by: Merryl Hacker, MD   Critical care provider statement:    Critical care time (minutes):  75   Critical care was necessary to treat or prevent imminent or life-threatening deterioration of the following conditions:  Dehydration and metabolic crisis (Hypotension, undifferentiated)  Critical care was time spent personally by me on the following activities:  Development of treatment plan with patient or surrogate, discussions with consultants, evaluation of patient's response to treatment, examination of patient, ordering and review of laboratory studies, ordering and review of radiographic studies, ordering and performing treatments and interventions, pulse oximetry, re-evaluation of patient's condition and review of old charts    Medications Ordered in ED Medications  vancomycin (VANCOCIN) IVPB 1000 mg/200 mL premix (1,000 mg Intravenous New Bag/Given 03/22/21 0647)  ceFEPIme (MAXIPIME) 2 g in sodium chloride 0.9 % 100 mL IVPB (2 g Intravenous New Bag/Given 03/22/21 0645)  hydrocortisone sodium succinate (SOLU-CORTEF) 100 MG injection 100 mg (has no administration in time range)  sodium chloride 0.9 % bolus 1,000 mL (1,000 mLs Intravenous New Bag/Given 03/22/21 0334)  sodium chloride 0.9 % bolus 1,000 mL (1,000 mLs Intravenous New Bag/Given 03/22/21 0622)    ED Course/ Medical Decision Making/ A&P Clinical Course as of 03/22/21 0707  Wed Mar 22, 2021  0535 Persistent hypotension and hypothermia.  Added a TSH.  Requested manual blood pressure.  Third liter of fluids ordered.  Patient continues to [facility] appropriately. [CH]  0542 Patient is mentating appropriately.  States she feels better  after fluids.  However, continues to have documented low blood pressures.  We reviewed her entire medication list.  It appears she is only on losartan for blood pressure.  Do not see any other culprit medications.  She did take some tizanidine but states she has taken this in the past without ill effect.  She has had her spinal stimulator for several years and has not had any blood pressure issues related to that. [CH]  [id number] Blood pressure 100/63.  She still remains relatively asymptomatic.  Third liter of fluids going. [CH]  0706 Cortisol level added.  Patient does not have any reason to have adrenal insufficiency but given persistent hypotension, will add cortisol level and give a dose of Solu-Cortef.  Lactate is normal.  This would argue against septic shock or other shock state.  Could be losartan effects in the setting of renal failure.  Will continue fluid resuscitation and reassess.  Will need admission. [CH]    Clinical Course User Index [CH] Laquan Ludden, Barbette Hair, MD                           Medical Decision Making Amount and/or Complexity of Data Reviewed Labs: ordered. Radiology: ordered.  Risk Prescription drug management.   This patient presents to the ED for concern of hypotension, this involves an extensive number of treatment options, and is a complaint that carries with it a high risk of complications and morbidity.  The differential diagnosis includes dehydration, shock state such as sepsis, GI bleed, medication effect  MDM:   This is a 66 year old female who presents with hypotension.  States she felt lightheaded and weird but otherwise is relatively asymptomatic.  No recent fevers or illnesses.  Of note she is slightly hypothermic and slightly bradycardic.  EKG shows low voltages which is consistent with prior.  No pulses paradoxus.  She has no chest pain or shortness of breath.  She is satting 100% on room air.  She is no respiratory distress.  I have low suspicion for cardiac  tamponade or PE as a cause.  Given hypothermia and persistent hypotension, lactate was ordered in addition to fluids to evaluate for sepsis.  She does not complain of any infectious symptoms.  Urinalysis and chest x-ray were also ordered.  Chest x-ray without pneumonia.  Lactate 1.4.  Urinalysis pending.  After several liters of fluid, she remained persistently hypotensive.  For this reason, she was given broad-spectrum antibiotics to cover for sepsis although I do not feel her presentation is necessarily consistent with infection.  I have added on a TSH level as well as a cortisol level and she was given Solu-Cortef for the potential of adrenal insufficiency.  Also added troponin to assess for pump failure although EKG does not show any ischemic changes.  She remains neuro intact and is reading a book at bedside.  She is comfortable.  No obvious source of bleeding.  Patient signed out to oncoming provider pending ongoing resuscitation.  She will need admission.  (Labs, imaging)  Labs: I Ordered, and personally interpreted labs.  The pertinent results include: AKI with creatinine greater than 2  Imaging Studies ordered: I ordered imaging studies including chest x-ray I independently visualized and interpreted imaging. I agree with the radiologist interpretation  Additional history obtained from patient.  External records from outside source obtained and reviewed including prior visits  Critical Interventions: IV fluids, IV antibiotics, IV steroids  Consultations: I requested consultation with the NA,  and discussed lab and imaging findings as well as pertinent plan - they recommend: N/A  Cardiac Monitoring: The patient was maintained on a cardiac monitor.  I personally viewed and interpreted the cardiac monitored which showed an underlying rhythm of: Normal sinus rhythm  Reevaluation: After the interventions noted above, I reevaluated the patient and found that they have  :improved   Considered admission for: Will need admission  Social Determinants of Health: Lives independently  Disposition: Admit  Co morbidities that complicate the patient evaluation  Past Medical History:  Diagnosis Date   Anxiety    Asthma    Barrett's esophagus    Dr. Truman Hayward (EGD - 03/2013) Q 3 years    Chronic back pain    Chronic pain    Low Back L4-S1   Depression    Diabetes mellitus without complication (HCC)    GERD (gastroesophageal reflux disease)    Hypertension    Internal hemorrhoids    Dr. Marin Comment ([place]   Migraine    Sleep apnea    Spinal stenosis      Medicines Meds ordered this encounter  Medications   sodium chloride 0.9 % bolus 1,000 mL   sodium chloride 0.9 % bolus 1,000 mL   vancomycin (VANCOCIN) IVPB 1000 mg/200 mL premix    Order Specific Question:   Indication:    Answer:   Other Indication (list below)    Order Specific Question:   Other Indication:    Answer:   unexplained hypotension   ceFEPIme (MAXIPIME) 2 g in sodium chloride 0.9 % 100 mL IVPB    Order Specific Question:   Antibiotic Indication:    Answer:   Other Indication (list below)    Order Specific Question:   Other Indication:    Answer:   hypotension; unexplained; sepsis coverage   hydrocortisone sodium succinate (SOLU-CORTEF) 100 MG injection 100 mg    I have reviewed the patients home medicines and have made adjustments as needed  Problem List / ED Course: Problem List Items Addressed This Visit   None Visit Diagnoses     AKI (acute kidney injury) ([place])    -  Primary   Hypotension, unspecified hypotension type  Final Clinical Impression(s) / ED Diagnoses Final diagnoses:  AKI (acute kidney injury) ([place])  Hypotension, unspecified hypotension type    Rx / DC Orders ED Discharge Orders     None         Bryar Rennie, Barbette Hair, MD 03/22/21 [phone]

## 2021-03-23 DIAGNOSIS — E1122 Type 2 diabetes mellitus with diabetic chronic kidney disease: Secondary | ICD-10-CM | POA: Diagnosis present

## 2021-03-23 DIAGNOSIS — J45909 Unspecified asthma, uncomplicated: Secondary | ICD-10-CM | POA: Diagnosis present

## 2021-03-23 DIAGNOSIS — M109 Gout, unspecified: Secondary | ICD-10-CM | POA: Diagnosis present

## 2021-03-23 DIAGNOSIS — Z20822 Contact with and (suspected) exposure to covid-19: Secondary | ICD-10-CM | POA: Diagnosis present

## 2021-03-23 DIAGNOSIS — Z87891 Personal history of nicotine dependence: Secondary | ICD-10-CM | POA: Diagnosis not present

## 2021-03-23 DIAGNOSIS — Z6841 Body Mass Index (BMI) 40.0 and over, adult: Secondary | ICD-10-CM | POA: Diagnosis not present

## 2021-03-23 DIAGNOSIS — G8929 Other chronic pain: Secondary | ICD-10-CM | POA: Diagnosis present

## 2021-03-23 DIAGNOSIS — Z7982 Long term (current) use of aspirin: Secondary | ICD-10-CM | POA: Diagnosis not present

## 2021-03-23 DIAGNOSIS — Z8249 Family history of ischemic heart disease and other diseases of the circulatory system: Secondary | ICD-10-CM | POA: Diagnosis not present

## 2021-03-23 DIAGNOSIS — K219 Gastro-esophageal reflux disease without esophagitis: Secondary | ICD-10-CM | POA: Diagnosis present

## 2021-03-23 DIAGNOSIS — N179 Acute kidney failure, unspecified: Secondary | ICD-10-CM | POA: Diagnosis present

## 2021-03-23 DIAGNOSIS — Z79899 Other long term (current) drug therapy: Secondary | ICD-10-CM | POA: Diagnosis not present

## 2021-03-23 DIAGNOSIS — R001 Bradycardia, unspecified: Secondary | ICD-10-CM | POA: Diagnosis present

## 2021-03-23 DIAGNOSIS — E785 Hyperlipidemia, unspecified: Secondary | ICD-10-CM | POA: Diagnosis present

## 2021-03-23 DIAGNOSIS — N1832 Chronic kidney disease, stage 3b: Secondary | ICD-10-CM | POA: Diagnosis present

## 2021-03-23 DIAGNOSIS — I129 Hypertensive chronic kidney disease with stage 1 through stage 4 chronic kidney disease, or unspecified chronic kidney disease: Secondary | ICD-10-CM | POA: Diagnosis present

## 2021-03-23 DIAGNOSIS — Z880 Allergy status to penicillin: Secondary | ICD-10-CM | POA: Diagnosis not present

## 2021-03-23 DIAGNOSIS — I952 Hypotension due to drugs: Secondary | ICD-10-CM | POA: Diagnosis present

## 2021-03-23 DIAGNOSIS — G4733 Obstructive sleep apnea (adult) (pediatric): Secondary | ICD-10-CM | POA: Diagnosis present

## 2021-03-23 DIAGNOSIS — F32A Depression, unspecified: Secondary | ICD-10-CM | POA: Diagnosis present

## 2021-03-23 DIAGNOSIS — M4800 Spinal stenosis, site unspecified: Secondary | ICD-10-CM | POA: Diagnosis present

## 2021-03-23 DIAGNOSIS — T465X5A Adverse effect of other antihypertensive drugs, initial encounter: Secondary | ICD-10-CM | POA: Diagnosis present

## 2021-03-23 DIAGNOSIS — F419 Anxiety disorder, unspecified: Secondary | ICD-10-CM | POA: Diagnosis present

## 2021-03-23 LAB — CBG MONITORING, ED
Glucose-Capillary: 99 mg/dL (ref 70–99)
Glucose-Capillary: 125 mg/dL — ABNORMAL HIGH (ref 70–99)
Glucose-Capillary: 123 mg/dL — ABNORMAL HIGH (ref 70–99)

## 2021-03-23 LAB — GLUCOSE, CAPILLARY: Glucose-Capillary: 97 mg/dL (ref 70–99)

## 2021-03-23 LAB — HEMOGLOBIN A1C
Hgb A1c MFr Bld: 5.6 % (ref 4.8–5.6)
Mean Plasma Glucose: 114.02 mg/dL

## 2021-03-23 LAB — COMPREHENSIVE METABOLIC PANEL
ALT: 27 U/L (ref 0–44)
AST: 36 U/L (ref 15–41)
Albumin: 3.2 g/dL — ABNORMAL LOW (ref 3.5–5.0)
Alkaline Phosphatase: 43 U/L (ref 38–126)
Anion gap: 7 (ref 5–15)
BUN: 23 mg/dL (ref 8–23)
CO2: 22 mmol/L (ref 22–32)
Calcium: 8.8 mg/dL — ABNORMAL LOW (ref 8.9–10.3)
Chloride: 110 mmol/L (ref 98–111)
Creatinine, Ser: 1.8 mg/dL — ABNORMAL HIGH (ref 0.44–1.00)
GFR, Estimated: 31 mL/min — ABNORMAL LOW (ref >60)
Glucose, Bld: 85 mg/dL (ref 70–99)
Potassium: 4 mmol/L (ref 3.5–5.1)
Sodium: 139 mmol/L (ref 135–145)
Total Bilirubin: 0.3 mg/dL (ref 0.3–1.2)
Total Protein: 6.1 g/dL — ABNORMAL LOW (ref 6.5–8.1)

## 2021-03-23 LAB — CBC
HCT: 34.8 % — ABNORMAL LOW (ref 36.0–46.0)
Hemoglobin: 11.5 g/dL — ABNORMAL LOW (ref 12.0–15.0)
MCH: 31.1 pg (ref 26.0–34.0)
MCHC: 33 g/dL (ref 30.0–36.0)
MCV: 94.1 fL (ref 80.0–100.0)
Platelets: 254 10*3/uL (ref 150–400)
RBC: 3.7 MIL/uL — ABNORMAL LOW (ref 3.87–5.11)
RDW: 14.2 % (ref 11.5–15.5)
WBC: 8.2 10*3/uL (ref 4.0–10.5)
nRBC: 0 % (ref 0.0–0.2)

## 2021-03-23 LAB — PROCALCITONIN: Procalcitonin: <0.1 ng/mL

## 2021-03-23 LAB — HIV ANTIBODY (ROUTINE TESTING W REFLEX): HIV Screen 4th Generation wRfx: NONREACTIVE

## 2021-03-23 MED ORDER — ASPIRIN 81 MG PO CHEW
81.0000 mg | CHEWABLE_TABLET | Freq: Every day | ORAL | Status: DC
  Administered 2021-03-23: 81 mg via ORAL
  Filled 2021-03-23: qty 1

## 2021-03-23 MED ORDER — BUPRENORPHINE HCL-NALOXONE HCL 8-2 MG SL SUBL
1.0000 | SUBLINGUAL_TABLET | Freq: Every day | SUBLINGUAL | Status: DC
  Administered 2021-03-23 – 2021-03-24 (×2): 1 via SUBLINGUAL
  Filled 2021-03-23 (×2): qty 1

## 2021-03-23 MED ORDER — TIZANIDINE HCL 4 MG PO TABS: 2.0000 mg | ORAL_TABLET | Freq: Once | ORAL | Status: DC

## 2021-03-23 MED ORDER — TRAMADOL HCL 50 MG PO TABS
50.0000 mg | ORAL_TABLET | Freq: Once | ORAL | Status: AC
  Administered 2021-03-23: 50 mg via ORAL
  Filled 2021-03-23: qty 1

## 2021-03-23 NOTE — Progress Notes (Signed)
PROGRESS NOTE  Martha Nolan  DOB: [birth year]  PCP: Cipriano Mile, NP [id number]  DOA: 03/22/2021  LOS: 0 days  Hospital Day: 2  Brief narrative: Martha Nolan is a 66 y.o. female with PMH significant for morbid obesity, OSA, DM2, HTN, GERD, Barrett's esophagus, migraine, asthma, anxiety/depression, chronic back pain, spinal stenosis Patient was brought to the ED on 2/15 by EMS for general malaise, dizziness and persistent low blood pressure on checking by self at home.  As her symptoms progressed, she called EMS.  EMS noted blood pressure low at 60s/40s and brought her to the ED.  On arrival, her blood pressure was low in 60s, mildly bradycardic. Labs with creatinine elevated 2.18 compared to 1.25. Cultures sent. She was given several fluid boluses with some improvement in blood pressure. Admitted to hospitalist service PCCM was consulted  Subjective: Patient was seen and examined this morning.  Pleasant middle-aged Caucasian female.  Sitting up in bed.  Not in distress. Dozing of during conversation.  States that she is having it more frequently for last few weeks.  Inconsistently compliant to CPAP Chart reviewed.  Principal Problem:   AKI (acute kidney injury) ([place]) Active Problems:   DM2 (diabetes mellitus, type 2) (HCC)   HLD (hyperlipidemia)   CKD (chronic kidney disease) stage 3, GFR 30-59 ml/min (HCC)   Chronic pain   Tobacco abuse    Assessment and Plan: Persistent hypotension -Presented with hypotension, lightheadedness, dizziness  -Blood pressure in 60s at presentation, probably had for few days, because her creatinine is also elevated on initial lab. -Unclear etiology of hypertension-possibilities include infection, polypharmacy.   -Blood culture sent but no evidence of infection at this time.  UA negative.  Empirically started on IV cefepime and vancomycin.  Monitor off antibiotics while waiting for culture report -Blood pressure improving now with IV fluid  boluses.  No history of adrenal crisis but Solu-Cortef was given empirically.  ACTH stimulation test was ordered for this morning -Home meds include Lasix 20 mg twice daily, losartan 25 mg daily.  Currently on hold -Continue to monitor blood pressure.    AKI on CKD 3B -Presented with creatinine elevated 2.18, baseline creatinine 1.25 from just 4 days ago -Currently on IV fluid.  Creatinine improved to 1.8 this morning.  Continue to monitor Recent Labs    03/19/21 1916 03/22/21 0256 03/23/21 0455  BUN 13 24* 23  CREATININE 1.25* 2.18* 1.80*   Type 2 diabetes mellitus -A1c 5.6 on 03/23/2021 -Home meds include Januvia 50 mg daily, -Currently on hold.  Continue sliding scale insulin with Accu-Cheks Recent Labs  Lab 03/22/21 0331 03/22/21 1744 03/22/21 2148 03/23/21 0806 03/23/21 1207  GLUCAP 123* 110* 95 99 125*   Hyperlipidemia -Continue aspirin 81 mg daily, Crestor 20 mg daily, fenofibrate,  Chronic back pain, has a spinal cord stimulator in place Anxiety/depression -Home meds include Belbuca 450 mg film twice daily, pregabalin 100 mg 3 times daily, Cymbalta 60 mg daily, Wellbutrin 150 mg daily, Zanaflex 2 mg 3 times daily as needed, trazodone 25 mg as needed, Excedrin every 6 hours as needed. -There is a chance that she might be going through polypharmacy causing increased sleepiness and hypotension.  Counseled to minimize the use of medicines.   GERD -PPI  Gout -Allopurinol,  Morbid obesity  -Body mass index is 43.27 kg/m. Patient has been advised to make an attempt to improve diet and exercise patterns to aid in weight loss.  OSA -Encourage compliance to CPAP.  CPAP  Mobility: Encourage  ambulation Goals of care   Code Status: Full Code    Nutritional status:  Body mass index is 43.27 kg/m.      Diet:  Diet Order             Diet Carb Modified Fluid consistency: Thin; Room service appropriate? Yes  Diet effective now                   DVT  prophylaxis: Lovenox subcu    Antimicrobials: Empiric antibiotics stopped this morning Fluid: Normal saline at 100 mill per hour Consultants: None Family Communication: None at bedside  Status is: Observation  Continue in-hospital care because: Needs to monitor blood pressure, pending culture report, ACTH stimulation test Level of care: Telemetry   Dispo: The patient is from: Home              Anticipated d/c is to: Hopefully home in 1 to 2 days              Patient currently is not medically stable to d/c.   Difficult to place patient No     Infusions:   sodium chloride 100 mL/hr at 03/23/21 1039    Scheduled Meds:  aspirin  81 mg Oral QHS   azelastine  2 spray Each Nare BID   buprenorphine-naloxone  1 tablet Sublingual Daily   buPROPion  150 mg Oral Daily   DULoxetine  60 mg Oral Daily   enoxaparin (LOVENOX) injection  55 mg Subcutaneous Q24H   famotidine  40 mg Oral QHS   fenofibrate  160 mg Oral Daily   fluticasone furoate-vilanterol  1 puff Inhalation Daily   folic acid  1 mg Oral Daily   insulin aspart  0-15 Units Subcutaneous TID WC   loratadine  10 mg Oral Daily   pantoprazole  40 mg Oral BID   potassium chloride  20 mEq Oral BID   pregabalin  50 mg Oral TID   rosuvastatin  20 mg Oral QHS    PRN meds: acetaminophen **OR** acetaminophen, albuterol, ondansetron **OR** ondansetron (ZOFRAN) IV, tiZANidine, traZODone   Antimicrobials: Anti-infectives (From admission, onward)    Start     Dose/Rate Route Frequency Ordered Stop   03/24/21 1200  vancomycin (VANCOREADY) IVPB 1250 mg/250 mL  Status:  Discontinued        1,250 mg 166.7 mL/hr over 90 Minutes Intravenous Every 48 hours 03/22/21 1245 03/23/21 1033   03/22/21 2000  ceFEPIme (MAXIPIME) 2 g in sodium chloride 0.9 % 100 mL IVPB  Status:  Discontinued        2 g 200 mL/hr over 30 Minutes Intravenous Every 12 hours 03/22/21 1232 03/23/21 1033   03/22/21 1300  vancomycin (VANCOCIN) IVPB 1000 mg/200 mL  premix        1,000 mg 200 mL/hr over 60 Minutes Intravenous  Once 03/22/21 1245 03/22/21 1630   03/22/21 0600  vancomycin (VANCOCIN) IVPB 1000 mg/200 mL premix        1,000 mg 200 mL/hr over 60 Minutes Intravenous  Once 03/22/21 0555 03/22/21 0751   03/22/21 0600  ceFEPIme (MAXIPIME) 2 g in sodium chloride 0.9 % 100 mL IVPB        2 g 200 mL/hr over 30 Minutes Intravenous  Once 03/22/21 0555 03/22/21 0751       Objective: Vitals:   03/23/21 0814 03/23/21 1100  BP: (!) 149/79 (!) 102/50  Pulse: 69 68  Resp: 13 16  Temp:    SpO2: 99% 94%  Intake/Output Summary (Last 24 hours) at 03/23/2021 1208 Last data filed at 03/22/2021 2125 Gross per 24 hour  Intake 292.55 ml  Output --  Net 292.55 ml   Filed Weights   03/22/21 1755  Weight: 117.9 kg   Weight change:  Body mass index is 43.27 kg/m.   Physical Exam: General exam: Pleasant, middle-aged morbidly obese Caucasian female.  Not in physical distress at this time Skin: No rashes, lesions or ulcers. HEENT: Atraumatic, normocephalic, no obvious bleeding Lungs: Clear to auscultation bilaterally CVS: Regular rate and rhythm, no murmur GI/Abd soft, nontender, nondistended, bowel sound present CNS: Alert, awake, oriented x3 Psychiatry: Mood appropriate Extremities: No pedal edema, no calf tenderness  Data Review: I have personally reviewed the laboratory data and studies available.  F/u labs ordered Unresulted Labs (From admission, onward)     Start     Ordered   03/29/21 0500  Creatinine, serum  (enoxaparin (LOVENOX)    CrCl >/= 30 ml/min)  Weekly,   R     Comments: while on enoxaparin therapy    03/22/21 2105   03/23/21 0900  ACTH stimulation, 3 time points  Once,   R        03/23/21 0900   03/22/21 2105  HIV Antibody (routine testing w rflx)  (HIV Antibody (Routine testing w reflex) panel)  Once,   R        03/22/21 2105   Pending  Procalcitonin - Baseline  ONCE - STAT,   STAT        Pending   Pending   Procalcitonin  Daily,   R      Pending            Signed, Terrilee Croak, MD [facility] 03/23/2021

## 2021-03-24 LAB — ACTH STIMULATION, 3 TIME POINTS
Cortisol, 30 Min: 14.7 ug/dL
Cortisol, 60 Min: 20.1 ug/dL
Cortisol, Base: 4 ug/dL

## 2021-03-24 LAB — GLUCOSE, CAPILLARY
Glucose-Capillary: 93 mg/dL (ref 70–99)
Glucose-Capillary: 124 mg/dL — ABNORMAL HIGH (ref 70–99)

## 2021-03-24 LAB — RENAL FUNCTION PANEL
Albumin: 3.3 g/dL — ABNORMAL LOW (ref 3.5–5.0)
Anion gap: 6 (ref 5–15)
BUN: 20 mg/dL (ref 8–23)
CO2: 22 mmol/L (ref 22–32)
Calcium: 8.9 mg/dL (ref 8.9–10.3)
Chloride: 108 mmol/L (ref 98–111)
Creatinine, Ser: 1.44 mg/dL — ABNORMAL HIGH (ref 0.44–1.00)
GFR, Estimated: 41 mL/min — ABNORMAL LOW (ref >60)
Glucose, Bld: 104 mg/dL — ABNORMAL HIGH (ref 70–99)
Phosphorus: 2.7 mg/dL (ref 2.5–4.6)
Potassium: 4 mmol/L (ref 3.5–5.1)
Sodium: 136 mmol/L (ref 135–145)

## 2021-03-24 MED ORDER — COSYNTROPIN 0.25 MG IJ SOLR
0.2500 mg | Freq: Once | INTRAMUSCULAR | Status: AC
Start: 2021-03-24 — End: 2021-03-24
  Administered 2021-03-24: 0.25 mg via INTRAVENOUS
  Filled 2021-03-24: qty 0.25

## 2021-03-24 NOTE — Plan of Care (Signed)
  Problem: Clinical Measurements: Goal: Ability to maintain clinical measurements within normal limits will improve Outcome: Progressing   Problem: Activity: Goal: Risk for activity intolerance will decrease Outcome: Progressing   Problem: Coping: Goal: Level of anxiety will decrease Outcome: Progressing   

## 2021-03-24 NOTE — Discharge Summary (Signed)
Physician Discharge Summary  Martha Nolan [id number] DOB: 04-Jun-1955 DOA: 03/22/2021  PCP: Cipriano Mile, NP  Admit date: 03/22/2021 Discharge date: 03/24/2021  Admitted From: home Disposition:  home  Recommendations for Outpatient Follow-up:  Follow up with PCP in 1 week  Repeat CBC and BMP on follow-up visit  Continue to hold losartan and Lasix at this time Monitor blood pressure closely at home Compression stockings, leg elevation for leg swelling Follow-up on pending ACTH stimulation test Follow-up with nephrology as a scheduled outpatient  Home Health: None Equipment/Devices: None Discharge Condition: Stable CODE STATUS: Full code Diet recommendation: Regular diet  Brief/Interim Summary: Martha Nolan is a 66 y.o. female with PMH significant for morbid obesity, OSA, DM2, HTN, GERD, Barrett's esophagus, migraine, asthma, anxiety/depression, chronic back pain, spinal stenosis Patient was brought to the ED on 2/15 by EMS for general malaise, dizziness and persistent low blood pressure on checking by self at home.  As her symptoms progressed, she called EMS.  EMS noted blood pressure low at 60s/40s and brought her to the ED.   On arrival, her blood pressure was low in 60s, mildly bradycardic. Labs with creatinine elevated 2.18 compared to 1.25. Cultures sent. She was given several fluid boluses with some improvement in blood pressure. Admitted to hospitalist service PCCM was consulted  Discharge Diagnoses:   Persistent hypotension -Presented with hypotension, lightheadedness, dizziness  -Blood pressure in 60s at presentation, probably had for few days, because her creatinine is also elevated on initial lab. -Unclear etiology of hypertension-possibilities include infection, polypharmacy.   -Blood culture sent but no evidence of infection at this time.  UA negative.  Chest x-ray negative for pneumonia.  Empirically started on IV cefepime and vancomycin and admission.    -Blood pressure improving now with IV fluid boluses.  No history of adrenal crisis but Solu-Cortef was given empirically.  ACTH stimulation test was ordered -pending -Home medication losartan and Lasix were held. -Blood pressure 111/61 this morning.  Her kidney function improved.  Stable to go home today. -Continue to hold Lasix and losartan at the time of discharge.  Monitor blood pressure at home closely and recommend to follow-up with PCP in 1 week.  AKI on CKD 3B -Presented with creatinine elevated 2.18, baseline creatinine 1.25 from just 4 days ago -Kidney function improved-creatinine from 2.18-1.4 this morning.  Renal ultrasound negative for any acute findings. -Follow-up with nephrology outpatient   Type 2 diabetes mellitus -A1c 5.6 on 03/23/2021 -Home meds include Januvia 50 mg daily, -On sliding scale insulin.  Resumed Januvia at discharge  Hyperlipidemia -Continued Crestor  Chronic back pain, has a spinal cord stimulator in place Anxiety/depression -Home meds include Belbuca 450 mg film twice daily, pregabalin 100 mg 3 times daily, Cymbalta 60 mg daily, Wellbutrin 150 mg daily, Zanaflex 2 mg 3 times daily as needed, trazodone 25 mg as needed, Excedrin every 6 hours as needed. -There is a chance that she might be going through polypharmacy causing increased sleepiness and hypotension.   -Counseled to minimize the use of medicines.    GERD -Remained stable.  Continued PPI   Gout -Remained stable.  Continued allopurinol,   Morbid obesity  -Body mass index is 43.27 kg/m. Patient has been advised to make an attempt to improve diet and exercise patterns to aid in weight loss.   OSA -CPAP at bedtime  Discharge Instructions  Discharge Instructions     Diet general   Complete by: As directed    Increase activity slowly   Complete  by: As directed       Allergies as of 03/24/2021       Reactions   Penicillins Hives        Medication List     STOP taking these  medications    doxycycline 100 MG capsule Commonly known as: MONODOX   furosemide 20 MG tablet Commonly known as: LASIX   irbesartan 150 MG tablet Commonly known as: AVAPRO   losartan 25 MG tablet Commonly known as: COZAAR   metFORMIN 500 MG 24 hr tablet Commonly known as: GLUCOPHAGE-XR       TAKE these medications    albuterol 108 (90 Base) MCG/ACT inhaler Commonly known as: VENTOLIN HFA Inhale 1 puff into the lungs every 6 (six) hours as needed for wheezing or shortness of breath.   allopurinol 100 MG tablet Commonly known as: ZYLOPRIM Take 100 mg by mouth daily.   aspirin 81 MG chewable tablet Chew 81 mg by mouth daily.   aspirin-acetaminophen-caffeine 250-250-65 MG tablet Commonly known as: EXCEDRIN MIGRAINE Take 1-2 tablets by mouth every 6 (six) hours as needed for headache.   azelastine 0.1 % nasal spray Commonly known as: ASTELIN Place 2 sprays into both nostrils 2 (two) times daily.   Belbuca 450 MCG Film Generic drug: Buprenorphine HCl Take 450 mcg by mouth 2 (two) times daily.   buPROPion 300 MG 24 hr tablet Commonly known as: WELLBUTRIN XL Take 300 mg by mouth daily.   cetirizine 10 MG tablet Commonly known as: ZYRTEC Take 10 mg by mouth daily.   Chromium 1000 MCG Tabs Take 1,000 mcg by mouth daily.   CRANBERRY PO Take 1 tablet by mouth 2 (two) times daily.   D3 5000 125 MCG (5000 UT) capsule Generic drug: Cholecalciferol Take 5,000 Units by mouth daily.   docusate sodium 100 MG capsule Commonly known as: COLACE Take 200 mg by mouth at bedtime.   DULoxetine 60 MG capsule Commonly known as: CYMBALTA Take 60 mg by mouth daily.   famotidine 40 MG tablet Commonly known as: PEPCID Take 40 mg by mouth at bedtime.   Fenofibric Acid 135 MG Cpdr Take 135 mg by mouth daily.   ferrous sulfate 325 (65 FE) MG tablet Take 325 mg by mouth See admin instructions. 325mg  daily on Tuesday Thursday Saturday Sunday   FOLATE PO Take 1 tablet by  mouth daily.   gabapentin 600 MG tablet Commonly known as: NEURONTIN Take 600 mg by mouth 3 (three) times daily.   Garlic 123XX123 MG Caps Take 1,000 mg by mouth 2 (two) times daily.   Ginger Root 550 MG Caps Take 550 mg by mouth daily.   Januvia 50 MG tablet Generic drug: sitaGLIPtin Take 50 mg by mouth daily.   multivitamin with minerals tablet Take 1 tablet by mouth daily.   nitroGLYCERIN 0.2 mg/hr patch Commonly known as: Minitran Apply 1/4th patch to affected achilles, change daily   nystatin powder Commonly known as: MYCOSTATIN/NYSTOP Apply 1 application topically 4 (four) times daily as needed (yeast rash).   OVER THE COUNTER MEDICATION Take 2 tablets by mouth at bedtime. Gentle Laxitive   pantoprazole 40 MG tablet Commonly known as: PROTONIX Take 40 mg by mouth 2 (two) times daily.   potassium chloride SA 20 MEQ tablet Commonly known as: KLOR-CON M Take 1 tablet (20 mEq total) by mouth 2 (two) times daily.   pregabalin 100 MG capsule Commonly known as: LYRICA Take 100 mg by mouth 3 (three) times daily.   rosuvastatin 20  MG tablet Commonly known as: CRESTOR Take 20 mg by mouth at bedtime.   SUPER B COMPLEX PO Take 1 tablet by mouth daily.   tiZANidine 4 MG tablet Commonly known as: Zanaflex Take 1 tablet (4 mg total) by mouth every 6 (six) hours as needed for up to 8 doses for muscle spasms.   traZODone 50 MG tablet Commonly known as: DESYREL Take 25 mg by mouth at bedtime as needed for sleep.   Turmeric 500 MG Caps Take 500 mg by mouth daily.   vitamin E 180 MG (400 UNITS) capsule Take 400 Units by mouth 2 (two) times daily.   Wixela Inhub 250-50 MCG/ACT Aepb Generic drug: fluticasone-salmeterol Inhale 1 puff into the lungs 2 (two) times daily.        Allergies  Allergen Reactions   Penicillins Hives    Consultations: PCCM   Procedures/Studies: DG Chest 2 View  Result Date: 03/19/2021 CLINICAL DATA:  Chest pain EXAM: CHEST - 2  VIEW COMPARISON:  02/27/2021 FINDINGS: The heart size and mediastinal contours are within normal limits. Bandlike opacity within the periphery of the left lower lobe, likely atelectasis. Lungs appear otherwise clear. No pleural effusion or pneumothorax. Thoracic spinal stimulator leads are present. The visualized skeletal structures are unremarkable. IMPRESSION: Band-like opacity within the periphery of the left lower lobe, likely atelectasis. Otherwise, no acute cardiopulmonary findings. Electronically Signed   By: Davina Poke D.O.   On: 03/19/2021 18:50   DG Chest 2 View  Result Date: 02/28/2021 CLINICAL DATA:  Cough and wheezing. EXAM: CHEST - 2 VIEW COMPARISON:  August 17, 2011 FINDINGS: The heart size and mediastinal contours are within normal limits. Both lungs are clear. Radiopaque surgical clips are seen within the abdomen on the lateral view. Spinal stimulator wires are seen with the distal tips overlying the superior endplate of the T8 vertebral body. The visualized skeletal structures are unremarkable. IMPRESSION: No active cardiopulmonary disease. Electronically Signed   By: Virgina Norfolk M.D.   On: 02/28/2021 16:57   DG Lumbar Spine Complete  Result Date: 03/18/2021 CLINICAL DATA:  Fall.  Low back pain. EXAM: LUMBAR SPINE - COMPLETE 4+ VIEW COMPARISON:  None. FINDINGS: Five lumbar type vertebra. There is no acute fracture or subluxation of the lumbar spine. Multilevel degenerative changes with disc space narrowing and endplate irregularity and spurring. There is significant disc space narrowing and spurring at T12-L1. Grade 1 L2-L3 and L4-L5 retrolisthesis and L3-L4 anterolisthesis. Multilevel facet arthropathy. The soft tissues are unremarkable. Right upper quadrant cholecystectomy clips. Spinal stimulator. IMPRESSION: 1. No acute fracture or subluxation. 2. Multilevel degenerative changes. Electronically Signed   By: Anner Crete M.D.   On: 03/18/2021 23:27   DG Pelvis 1-2  Views  Result Date: 03/18/2021 CLINICAL DATA:  Low back and left hip pain after fall. EXAM: PELVIS - 1-2 VIEW COMPARISON:  None. FINDINGS: There is a calcification in the lower left abdomen which is nonspecific. No fractures identified.  No other acute abnormalities. IMPRESSION: No fractures identified. The 3 mm nodule in the lower left abdomen is nonspecific. A ureteral stone cannot be excluded on this study. Recommend clinical correlation. Electronically Signed   By: Dorise Bullion III M.D.   On: 03/18/2021 18:14   US RENAL  Result Date: 03/22/2021 CLINICAL DATA:  Acute kidney injury EXAM: RENAL / URINARY TRACT ULTRASOUND COMPLETE COMPARISON:  None. FINDINGS: Right Kidney: Renal measurements: 12.2 x 3.8 x 5.4 cm = volume: 133 mL. Echogenicity within normal limits. No mass or hydronephrosis  visualized. Left Kidney: Renal measurements: 9.5 x 4.9 x 4.0 cm = volume: 96 mL. Echogenicity within normal limits. No mass or hydronephrosis visualized. Bladder: Appears normal for degree of bladder distention. Other: None. IMPRESSION: Normal sonographic appearance of the kidneys. Electronically Signed   By: Acquanetta Belling M.D.   On: 03/22/2021 12:21   DG Chest Portable 1 View  Result Date: 03/22/2021 CLINICAL DATA:  Hypotension. EXAM: PORTABLE CHEST 1 VIEW COMPARISON:  03/19/2021 FINDINGS: Heart size and mediastinal contours are normal. Subsegmental atelectasis versus scarring noted in the left base. No pleural effusion, airspace consolidation or interstitial edema. Dorsal column stimulator identified with tip terminating in the upper thoracic spine. IMPRESSION: Left base atelectasis versus scarring. Electronically Signed   By: Signa Kell M.D.   On: 03/22/2021 06:35   DG Hand Complete Right  Result Date: 03/18/2021 CLINICAL DATA:  Right hand pain. EXAM: RIGHT HAND - COMPLETE 3+ VIEW COMPARISON:  None. FINDINGS: There is no acute fracture or dislocation. No significant arthritic changes. The soft tissues are  unremarkable IMPRESSION: Negative. Electronically Signed   By: Elgie Collard M.D.   On: 03/18/2021 22:35      Subjective: Patient seen and examined.  Sitting comfortably on the bed.  Tells me that she feels better this morning.  Denies lightheadedness, dizziness, weakness, nausea, vomiting.  No acute events overnight.  Comfortable going home today.  Discharge Exam: Vitals:   03/24/21 0636 03/24/21 0745  BP: 111/61   Pulse: 73   Resp:    Temp: 98.1 F (36.7 C)   SpO2: 99% 97%   Vitals:   03/23/21 2032 03/24/21 0322 03/24/21 0636 03/24/21 0745  BP: 124/82 (!) 111/58 111/61   Pulse: 65 72 73   Resp: 18 17    Temp: 98 F (36.7 C) (!) 97.4 F (36.3 C) 98.1 F (36.7 C)   TempSrc: Oral Oral Oral   SpO2: 100% 97% 99% 97%  Weight:      Height:        General: Pt is alert, awake, not in acute distress, obese, communicating well, very pleasant person Cardiovascular: RRR, S1/S2 +, no rubs, no gallops, 1+ bilateral pitting edema positive Respiratory: CTA bilaterally, no wheezing, no rhonchi Abdominal: Soft, NT, ND, bowel sounds + Extremities: no edema, no cyanosis    The results of significant diagnostics from this hospitalization (including imaging, microbiology, ancillary and laboratory) are listed below for reference.     Microbiology: Recent Results (from the past 240 hour(s))  Blood culture (routine x 2)     Status: None (Preliminary result)   Collection Time: 03/22/21  5:58 AM   Specimen: BLOOD  Result Value Ref Range Status   Specimen Description   Final    BLOOD BLOOD RIGHT FOREARM Performed at [facility], [address], [place], [place] [address]    Special Requests   Final    BOTTLES DRAWN AEROBIC AND ANAEROBIC Blood Culture adequate volume Performed at [facility], [address] [place], [place] [address]    Culture   Final    NO GROWTH 2 DAYS Performed at [facility], [address], [place], [place]  [address]    Report Status PENDING  Incomplete  Blood culture (routine x 2)     Status: None (Preliminary result)   Collection Time: 03/22/21  6:45 AM   Specimen: BLOOD  Result Value Ref Range Status   Specimen Description   Final    BLOOD BLOOD LEFT FOREARM  Performed at [facility], [place] [address] [place], [place] [address]    Special Requests   Final    BOTTLES DRAWN AEROBIC AND ANAEROBIC Blood Culture results may not be optimal due to an excessive volume of blood received in culture bottles Performed at [facility] [address], [place], [place] [address]    Culture   Final    NO GROWTH 2 DAYS Performed at [facility] [address], [place], [place] [address]    Report Status PENDING  Incomplete  Resp Panel by RT-PCR (Flu A&B, Covid) Nasopharyngeal Swab     Status: None   Collection Time: 03/22/21  6:55 AM   Specimen: Nasopharyngeal Swab; Nasopharyngeal(NP) swabs in vial transport medium  Result Value Ref Range Status   SARS Coronavirus 2 by RT PCR NEGATIVE NEGATIVE Final    Comment: (NOTE) SARS-CoV-2 target nucleic acids are NOT DETECTED.  The SARS-CoV-2 RNA is generally detectable in upper respiratory specimens during the acute phase of infection. The lowest concentration of SARS-CoV-2 viral copies this assay can detect is 138 copies/mL. A negative result does not preclude SARS-Cov-2 infection and should not be used as the sole basis for treatment or other patient management decisions. A negative result may occur with  improper specimen collection/handling, submission of specimen other than nasopharyngeal swab, presence of viral mutation(s) within the areas targeted by this assay, and inadequate number of viral copies(<138 copies/mL). A negative result must be combined with clinical observations, patient history, and epidemiological information. The expected result is Negative.  Fact Sheet for Patients:   EntrepreneurPulse.com.au  Fact Sheet for Healthcare Providers:  IncredibleEmployment.be  This test is no t yet approved or cleared by the [place] FDA and  has been authorized for detection and/or diagnosis of SARS-CoV-2 by FDA under an Emergency Use Authorization (EUA). This EUA will remain  in effect (meaning this test can be used) for the duration of the COVID-19 declaration under Section 564(b)(1) of the Act, 21 U.S.C.section 360bbb-3(b)(1), unless the authorization is terminated  or revoked sooner.       Influenza A by PCR NEGATIVE NEGATIVE Final   Influenza B by PCR NEGATIVE NEGATIVE Final    Comment: (NOTE) The Xpert Xpress SARS-CoV-2/FLU/RSV plus assay is intended as an aid in the diagnosis of influenza from Nasopharyngeal swab specimens and should not be used as a sole basis for treatment. Nasal washings and aspirates are unacceptable for Xpert Xpress SARS-CoV-2/FLU/RSV testing.  Fact Sheet for Patients: EntrepreneurPulse.com.au  Fact Sheet for Healthcare Providers: IncredibleEmployment.be  This test is not yet approved or cleared by the [place] FDA and has been authorized for detection and/or diagnosis of SARS-CoV-2 by FDA under an Emergency Use Authorization (EUA). This EUA will remain in effect (meaning this test can be used) for the duration of the COVID-19 declaration under Section 564(b)(1) of the Act, 21 U.S.C. section 360bbb-3(b)(1), unless the authorization is terminated or revoked.  Performed at [facility], [place] [address], [place], [place] [address]      Labs: BNP (last 3 results) No results for input(s): BNP in the last 8760 hours. Basic Metabolic Panel: Recent Labs  Lab 03/19/21 1916 03/22/21 0256 03/23/21 0455 03/24/21 1013  NA 138 136 139 136  K 3.9 4.2 4.0 4.0  CL 102 102 110 108  CO2 25 26 22 22   GLUCOSE 110* 140* 85 104*  BUN 13  24* 23 20  CREATININE 1.25* 2.18* 1.80* 1.44*  CALCIUM  9.7 9.0 8.8* 8.9  PHOS  --   --   --  2.7   Liver Function Tests: Recent Labs  Lab 03/19/21 1916 03/23/21 0455 03/24/21 1013  AST 40 36  --   ALT 31 27  --   ALKPHOS 50 43  --   BILITOT 0.4 0.3  --   PROT 6.9 6.1*  --   ALBUMIN 3.9 3.2* 3.3*   No results for input(s): LIPASE, AMYLASE in the last 168 hours. No results for input(s): AMMONIA in the last 168 hours. CBC: Recent Labs  Lab 03/19/21 1916 03/22/21 0256 03/23/21 0455  WBC 8.5 7.5 8.2  NEUTROABS 5.4  --   --   HGB 14.0 11.8* 11.5*  HCT 43.3 36.7 34.8*  MCV 96.0 97.6 94.1  PLT 262 244 254   Cardiac Enzymes: No results for input(s): CKTOTAL, CKMB, CKMBINDEX, TROPONINI in the last 168 hours. BNP: Invalid input(s): POCBNP CBG: Recent Labs  Lab 03/23/21 1207 03/23/21 1725 03/23/21 2038 03/24/21 0746 03/24/21 1142  GLUCAP 125* 123* 97 93 124*   D-Dimer No results for input(s): DDIMER in the last 72 hours. Hgb A1c Recent Labs    03/23/21 0455  HGBA1C 5.6   Lipid Profile No results for input(s): CHOL, HDL, LDLCALC, TRIG, CHOLHDL, LDLDIRECT in the last 72 hours. Thyroid function studies Recent Labs    03/22/21 0545  TSH 2.025   Anemia work up No results for input(s): VITAMINB12, FOLATE, FERRITIN, TIBC, IRON, RETICCTPCT in the last 72 hours. Urinalysis    Component Value Date/Time   COLORURINE YELLOW 03/22/2021 [place] 03/22/2021 0955   LABSPEC 1.012 03/22/2021 0955   PHURINE 5.0 03/22/2021 0955   GLUCOSEU NEGATIVE 03/22/2021 0955   GLUCOSEU NEGATIVE 10/05/2013 1226   HGBUR NEGATIVE 03/22/2021 0955   BILIRUBINUR NEGATIVE 03/22/2021 0955   BILIRUBINUR neg 07/27/2013 0933   KETONESUR NEGATIVE 03/22/2021 0955   PROTEINUR NEGATIVE 03/22/2021 0955   UROBILINOGEN 0.2 10/05/2013 1226   NITRITE NEGATIVE 03/22/2021 0955   LEUKOCYTESUR NEGATIVE 03/22/2021 0955   Sepsis Labs Invalid input(s): PROCALCITONIN,  WBC,   LACTICIDVEN Microbiology Recent Results (from the past 240 hour(s))  Blood culture (routine x 2)     Status: None (Preliminary result)   Collection Time: 03/22/21  5:58 AM   Specimen: BLOOD  Result Value Ref Range Status   Specimen Description   Final    BLOOD BLOOD RIGHT FOREARM Performed at [facility], [place] [address], [place], [place] [address]    Special Requests   Final    BOTTLES DRAWN AEROBIC AND ANAEROBIC Blood Culture adequate volume Performed at [facility] [address], [place], [place] [address]    Culture   Final    NO GROWTH 2 DAYS Performed at [facility] [address] [place], [place] [address]    Report Status PENDING  Incomplete  Blood culture (routine x 2)     Status: None (Preliminary result)   Collection Time: 03/22/21  6:45 AM   Specimen: BLOOD  Result Value Ref Range Status   Specimen Description   Final    BLOOD BLOOD LEFT FOREARM Performed at [facility] [address], [place],  [address]    Special Requests   Final    BOTTLES DRAWN AEROBIC AND ANAEROBIC Blood Culture results may not be optimal due to an excessive volume of blood received in culture bottles Performed at [facility] [address]  [place], [place] [address]    Culture   Final    NO GROWTH 2 DAYS Performed at [facility], [place] [address] [place], [place] [address]    Report Status PENDING  Incomplete  Resp Panel by RT-PCR (Flu A&B, Covid) Nasopharyngeal Swab     Status: None   Collection Time: 03/22/21  6:55 AM   Specimen: Nasopharyngeal Swab; Nasopharyngeal(NP) swabs in vial transport medium  Result Value Ref Range Status   SARS Coronavirus 2 by RT PCR NEGATIVE NEGATIVE Final    Comment: (NOTE) SARS-CoV-2 target nucleic acids are NOT DETECTED.  The SARS-CoV-2 RNA is generally detectable in upper respiratory specimens during the acute phase of infection. The  lowest concentration of SARS-CoV-2 viral copies this assay can detect is 138 copies/mL. A negative result does not preclude SARS-Cov-2 infection and should not be used as the sole basis for treatment or other patient management decisions. A negative result may occur with  improper specimen collection/handling, submission of specimen other than nasopharyngeal swab, presence of viral mutation(s) within the areas targeted by this assay, and inadequate number of viral copies(<138 copies/mL). A negative result must be combined with clinical observations, patient history, and epidemiological information. The expected result is Negative.  Fact Sheet for Patients:  EntrepreneurPulse.com.au  Fact Sheet for Healthcare Providers:  IncredibleEmployment.be  This test is no t yet approved or cleared by the [place] FDA and  has been authorized for detection and/or diagnosis of SARS-CoV-2 by FDA under an Emergency Use Authorization (EUA). This EUA will remain  in effect (meaning this test can be used) for the duration of the COVID-19 declaration under Section 564(b)(1) of the Act, 21 U.S.C.section 360bbb-3(b)(1), unless the authorization is terminated  or revoked sooner.       Influenza A by PCR NEGATIVE NEGATIVE Final   Influenza B by PCR NEGATIVE NEGATIVE Final    Comment: (NOTE) The Xpert Xpress SARS-CoV-2/FLU/RSV plus assay is intended as an aid in the diagnosis of influenza from Nasopharyngeal swab specimens and should not be used as a sole basis for treatment. Nasal washings and aspirates are unacceptable for Xpert Xpress SARS-CoV-2/FLU/RSV testing.  Fact Sheet for Patients: EntrepreneurPulse.com.au  Fact Sheet for Healthcare Providers: IncredibleEmployment.be  This test is not yet approved or cleared by the [place] FDA and has been authorized for detection and/or diagnosis of SARS-CoV-2 by FDA under  an Emergency Use Authorization (EUA). This EUA will remain in effect (meaning this test can be used) for the duration of the COVID-19 declaration under Section 564(b)(1) of the Act, 21 U.S.C. section 360bbb-3(b)(1), unless the authorization is terminated or revoked.  Performed at [facility], [place] [address], [place],  [address]      Time coordinating discharge: Over 30 minutes  SIGNED:   Mckinley Jewel, MD  [facility] 03/24/2021, 12:57 PM Pager   If 7PM-7AM, please contact night-coverage [email or web address]

## 2021-03-24 NOTE — Plan of Care (Signed)
Discharge instructions given to the patient including medications. Home meds picked up from the pharmacy and handed to patient.

## 2021-03-27 LAB — CULTURE, BLOOD (ROUTINE X 2)
Culture: NO GROWTH
Culture: NO GROWTH
Special Requests: ADEQUATE

## 2021-04-05 ENCOUNTER — Other Ambulatory Visit (HOSPITAL_COMMUNITY): Payer: Self-pay | Admitting: Student

## 2021-04-05 ENCOUNTER — Other Ambulatory Visit: Payer: Self-pay | Admitting: Student

## 2021-04-05 DIAGNOSIS — N2889 Other specified disorders of kidney and ureter: Secondary | ICD-10-CM

## 2021-04-05 DIAGNOSIS — M25552 Pain in left hip: Secondary | ICD-10-CM

## 2021-04-10 ENCOUNTER — Ambulatory Visit (HOSPITAL_COMMUNITY): Admission: RE | Admit: 2021-04-10 | Payer: Medicare Other | Source: Ambulatory Visit

## 2021-04-13 ENCOUNTER — Ambulatory Visit (HOSPITAL_COMMUNITY)
Admission: RE | Admit: 2021-04-13 | Discharge: 2021-04-13 | Disposition: A | Discharge status: Home / Self Care | Payer: Medicare Other | Source: Ambulatory Visit | Attending: Student | Admitting: Student

## 2021-04-13 ENCOUNTER — Other Ambulatory Visit: Payer: Self-pay

## 2021-04-13 DIAGNOSIS — N2889 Other specified disorders of kidney and ureter: Secondary | ICD-10-CM

## 2021-04-13 DIAGNOSIS — M25552 Pain in left hip: Secondary | ICD-10-CM

## 2021-04-13 LAB — POCT I-STAT CREATININE: Creatinine, Ser: 1.3 mg/dL — ABNORMAL HIGH (ref 0.44–1.00)

## 2021-04-13 MED ORDER — IOHEXOL 300 MG/ML  SOLN
100.0000 mL | Freq: Once | INTRAMUSCULAR | Status: AC | PRN
  Administered 2021-04-13: 19:00:00 80 mL via INTRAVENOUS

## 2021-04-17 ENCOUNTER — Telehealth: Payer: Self-pay

## 2021-04-17 NOTE — Telephone Encounter (Signed)
NOTES SCANNED TO REFERRAL 

## 2021-04-20 ENCOUNTER — Other Ambulatory Visit: Payer: Self-pay | Admitting: Student

## 2021-04-20 ENCOUNTER — Ambulatory Visit
Admission: RE | Admit: 2021-04-20 | Discharge: 2021-04-20 | Disposition: A | Discharge status: Home / Self Care | Payer: Medicare Other | Source: Ambulatory Visit | Attending: Student | Admitting: Student

## 2021-04-21 ENCOUNTER — Telehealth: Payer: Self-pay

## 2021-04-21 NOTE — Telephone Encounter (Signed)
SCANNED NOTES TO REFERRAL ?

## 2021-04-24 ENCOUNTER — Other Ambulatory Visit: Payer: Medicare Other

## 2021-04-28 ENCOUNTER — Other Ambulatory Visit (HOSPITAL_COMMUNITY): Payer: Self-pay | Admitting: Student

## 2021-04-28 DIAGNOSIS — W19XXXA Unspecified fall, initial encounter: Secondary | ICD-10-CM

## 2021-05-01 ENCOUNTER — Other Ambulatory Visit (HOSPITAL_COMMUNITY): Payer: Self-pay | Admitting: Student

## 2021-05-01 DIAGNOSIS — Z9181 History of falling: Secondary | ICD-10-CM

## 2021-05-15 ENCOUNTER — Ambulatory Visit (HOSPITAL_COMMUNITY)
Admission: RE | Admit: 2021-05-15 | Discharge: 2021-05-15 | Disposition: A | Discharge status: Home / Self Care | Payer: Medicare Other | Source: Ambulatory Visit | Attending: Student | Admitting: Student

## 2021-05-15 DIAGNOSIS — Z9181 History of falling: Secondary | ICD-10-CM | POA: Insufficient documentation

## 2021-05-15 DIAGNOSIS — W19XXXA Unspecified fall, initial encounter: Secondary | ICD-10-CM | POA: Diagnosis not present

## 2021-06-02 ENCOUNTER — Telehealth: Payer: Self-pay

## 2021-06-02 ENCOUNTER — Encounter: Payer: Self-pay | Admitting: Cardiology

## 2021-06-02 ENCOUNTER — Ambulatory Visit (INDEPENDENT_AMBULATORY_CARE_PROVIDER_SITE_OTHER): Payer: Medicare Other

## 2021-06-02 ENCOUNTER — Ambulatory Visit (INDEPENDENT_AMBULATORY_CARE_PROVIDER_SITE_OTHER): Payer: Medicare Other | Admitting: Cardiology

## 2021-06-02 VITALS — BP 140/77 | HR 103 | Ht 65.0 in | Wt 269.0 lb

## 2021-06-02 DIAGNOSIS — I951 Orthostatic hypotension: Secondary | ICD-10-CM | POA: Diagnosis not present

## 2021-06-02 DIAGNOSIS — G4733 Obstructive sleep apnea (adult) (pediatric): Secondary | ICD-10-CM

## 2021-06-02 DIAGNOSIS — I10 Essential (primary) hypertension: Secondary | ICD-10-CM

## 2021-06-02 DIAGNOSIS — R071 Chest pain on breathing: Secondary | ICD-10-CM

## 2021-06-02 DIAGNOSIS — I2510 Atherosclerotic heart disease of native coronary artery without angina pectoris: Secondary | ICD-10-CM

## 2021-06-02 DIAGNOSIS — R002 Palpitations: Secondary | ICD-10-CM

## 2021-06-02 DIAGNOSIS — R600 Localized edema: Secondary | ICD-10-CM

## 2021-06-02 DIAGNOSIS — R9431 Abnormal electrocardiogram [ECG] [EKG]: Secondary | ICD-10-CM

## 2021-06-02 DIAGNOSIS — Z9989 Dependence on other enabling machines and devices: Secondary | ICD-10-CM

## 2021-06-02 DIAGNOSIS — I2583 Coronary atherosclerosis due to lipid rich plaque: Secondary | ICD-10-CM

## 2021-06-02 MED ORDER — METOPROLOL TARTRATE 100 MG PO TABS: 100.0000 mg | ORAL_TABLET | Freq: Two times a day (BID) | ORAL | 0 refills | Status: DC

## 2021-06-02 NOTE — Addendum Note (Signed)
Addended by: Theresia Majors on: 06/02/2021 02:45 PM ? ? Modules accepted: Orders ? ?

## 2021-06-02 NOTE — Telephone Encounter (Signed)
error 

## 2021-06-02 NOTE — Progress Notes (Unsigned)
Enrolled patient for a 14 day Zio XT  monitor to be mailed to patients home  °

## 2021-06-02 NOTE — Progress Notes (Signed)
? ?Cardiology CONSULT Note   ? ?Date:  06/02/2021  ? ?ID:  Martha Nolan, DOB Aug 29, 1955, MRN [id number] ? ?PCP:  Cipriano Mile, NP  ?Cardiologist:  Fransico Him, MD  ? ?Chief Complaint  ?Patient presents with  ? New Patient (Initial Visit)  ?  OSA, abnormal EKG, HTN  ? ? ?History of Present Illness:  ?Martha Nolan is a 66 y.o. female who is being seen today for the evaluation of abnormal EKG at the request of Martha Nova, MD. ? ?Martha Nolan is a 66 y.o. female with PMH significant for morbid obesity, OSA, DM2, HTN, GERD, ASCAD, CKD stage 3b, OSA on CPAP, DM2, chronic CHF (type unknown), Barrett's esophagus, migraine, asthma, anxiety/depression, chronic back pain, spinal stenosis.  Patient was brought to the ED on 03/22/21 by EMS for general malaise, dizziness and persistent low blood pressure on checking by self at home.  As her symptoms progressed, she called EMS.  EMS noted blood pressure low at 60s/40s and brought her to the ED. she was given multiple fluid boluses.  She was treated with Solu-Cortef empirically pending ACTH stimulation test to rule out adrenal crisis.  Her losartan and Lasix were held and blood pressure normalized.  Of note serum creatinine was 2.18 when she was admitted with a baseline 1.25 and it normalized back to 1.4 on the day of discharge. ? ?She recently saw her PCP and EKG was abnormal and her PCP has now referred her for further evaluation.  She also had OSA and has been on CPAP and wanted a sleep medicine MD.  She has chronic DOE related to her COPD and asthma and follows with a Pulmonologist. She thinks that her SOB is stable as long as she does not do significant exertional activity.  She has chronic LE edema and her Lasix was stopped due to hypotension.  Her legs have not been as swollen as they  had in the past.  She does not salt her food except for tomatoes and eggs.  Her legs usually are swollen after sitting for long periods of time - she says that all she does is sit.   She has tried compression hose in the past but hers are worn out.  SInce her hospitalization she denies any dizziness or syncope.  She denies any chest pain or pressure.  ? ?She is doing well with her CPAP device and thinks that she has gotten used to it.  She tolerates the mask and feels the pressure is adequate.  Since going on CPAP she feels rested in the am and has no significant daytime sleepiness.  She has had problems with mouth dryness and uses a nasal pillow mask without chin strap. She denies any nasal dryness or nasal congestion.  She does not think that he snores.    ? ?Past Medical History:  ?Diagnosis Date  ? Anxiety   ? Asthma   ? Barrett's esophagus   ? Dr. Truman Hayward (EGD - 03/2013) Q 3 years   ? Chronic back pain   ? Chronic pain   ? Low Back L4-S1  ? Depression   ? Diabetes mellitus without complication ([place])   ? GERD (gastroesophageal reflux disease)   ? Hypertension   ? Internal hemorrhoids   ? Dr. Marin Comment ([place]  ? Migraine   ? Sleep apnea   ? Spinal stenosis   ? ? ?Past Surgical History:  ?Procedure Laterality Date  ? CARPAL TUNNEL RELEASE    ? CESAREAN SECTION    ?  CESAREAN SECTION    ? CHOLECYSTECTOMY    ? DILATION AND CURETTAGE OF UTERUS  11/05/2012  ? ULNAR NERVE REPAIR    ? ? ?Current Medications: ?Current Meds  ?Medication Sig  ? albuterol (PROVENTIL HFA;VENTOLIN HFA) 108 (90 BASE) MCG/ACT inhaler Inhale 1 puff into the lungs every 6 (six) hours as needed for wheezing or shortness of breath.  ? allopurinol (ZYLOPRIM) 100 MG tablet Take 100 mg by mouth daily.  ? aspirin 81 MG chewable tablet Chew 81 mg by mouth daily.  ? aspirin-acetaminophen-caffeine (EXCEDRIN MIGRAINE) 250-250-65 MG tablet Take 1-2 tablets by mouth every 6 (six) hours as needed for headache.  ? azelastine (ASTELIN) 0.1 % nasal spray Place 2 sprays into both nostrils 2 (two) times daily.  ? B Complex-C (SUPER B COMPLEX PO) Take 1 tablet by mouth daily.  ? BELBUCA 600 MCG FILM in the morning and at bedtime.  ? buPROPion (WELLBUTRIN  XL) 300 MG 24 hr tablet Take 300 mg by mouth daily.  ? cetirizine (ZYRTEC) 10 MG tablet Take 10 mg by mouth daily.  ? Cholecalciferol (D3 5000) 125 MCG (5000 UT) capsule Take 5,000 Units by mouth daily.  ? Choline Fenofibrate (FENOFIBRIC ACID) 135 MG CPDR Take 135 mg by mouth daily.  ? Chromium 1000 MCG TABS Take 1,000 mcg by mouth daily.  ? CRANBERRY PO Take 1 tablet by mouth 2 (two) times daily.  ? docusate sodium (COLACE) 100 MG capsule Take 200 mg by mouth at bedtime.  ? DULoxetine (CYMBALTA) 60 MG capsule Take 60 mg by mouth daily.  ? famotidine (PEPCID) 40 MG tablet Take 40 mg by mouth at bedtime.  ? ferrous sulfate 325 (65 FE) MG tablet Take 325 mg by mouth See admin instructions. 325mg  daily on Tuesday Thursday Saturday Sunday  ? Folic Acid (FOLATE PO) Take 1 tablet by mouth daily.  ? Garlic 123XX123 MG CAPS Take 1,000 mg by mouth 2 (two) times daily.  ? Ginger, Zingiber officinalis, (GINGER ROOT) 550 MG CAPS Take 550 mg by mouth daily.  ? JANUVIA 50 MG tablet Take 50 mg by mouth daily.  ? ketoconazole (NIZORAL) 2 % cream Apply topically in the morning and at bedtime.  ? Multiple Vitamins-Minerals (MULTIVITAMIN WITH MINERALS) tablet Take 1 tablet by mouth daily.  ? nystatin (MYCOSTATIN/NYSTOP) powder Apply 1 application topically 4 (four) times daily as needed (yeast rash).  ? OVER THE COUNTER MEDICATION Take 2 tablets by mouth at bedtime. Gentle Laxitive  ? pantoprazole (PROTONIX) 40 MG tablet Take 40 mg by mouth 2 (two) times daily.  ? potassium chloride SA (K-DUR,KLOR-CON) 20 MEQ tablet Take 1 tablet (20 mEq total) by mouth 2 (two) times daily.  ? pregabalin (LYRICA) 100 MG capsule Take 100 mg by mouth 3 (three) times daily.  ? rosuvastatin (CRESTOR) 20 MG tablet Take 20 mg by mouth at bedtime.  ? Turmeric 500 MG CAPS Take 500 mg by mouth daily.  ? vitamin E 180 MG (400 UNITS) capsule Take 400 Units by mouth 2 (two) times daily.  ? XARELTO 20 MG TABS tablet Take 20 mg by mouth daily.  ? [DISCONTINUED] BELBUCA  450 MCG FILM Take 450 mcg by mouth 2 (two) times daily.  ? [DISCONTINUED] gabapentin (NEURONTIN) 600 MG tablet Take 600 mg by mouth 3 (three) times daily.  ? [DISCONTINUED] nitroGLYCERIN (MINITRAN) 0.2 mg/hr patch Apply 1/4th patch to affected achilles, change daily  ? [DISCONTINUED] tiZANidine (ZANAFLEX) 4 MG tablet Take 1 tablet (4 mg total) by mouth every  6 (six) hours as needed for up to 8 doses for muscle spasms.  ? [DISCONTINUED] traZODone (DESYREL) 50 MG tablet Take 25 mg by mouth at bedtime as needed for sleep.  ? [DISCONTINUED] WIXELA INHUB 250-50 MCG/ACT AEPB Inhale 1 puff into the lungs 2 (two) times daily.  ? ? ?Allergies:   Penicillins  ? ?Social History  ? ?Socioeconomic History  ? Marital status: Single  ?  Spouse name: Not on file  ? Number of children: 1  ? Years of education: 62  ? Highest education level: Not on file  ?Occupational History  ? Occupation: DISABLED   ?Tobacco Use  ? Smoking status: Former  ?  Packs/day: 0.50  ?  Years: 35.00  ?  Pack years: 17.50  ?  Types: Cigarettes, E-cigarettes  ?  Quit date: 02/05/2013  ?  Years since quitting: 8.3  ? Smokeless tobacco: Not on file  ?Substance and Sexual Activity  ? Alcohol use: No  ? Drug use: No  ? Sexual activity: Yes  ?  Partners: Female  ?Other Topics Concern  ? Not on file  ?Social History Narrative  ? ** Merged History Encounter **  ?    ? Marital Status: She has a Soil scientist Chiropodist)  ? Children:  Daughter Mickel Baas)   ? Pets: Dogs (2), Cats (2)    ? Living Situation:  Lives with domestic partner and children.  ? Occupation:  Disabled    ? Education:  [employer]   ? Tobacco Use/Exposure:  She quit smoking in 1/15.    ? Alcohol Use:  No  ? Drug Use: No  ? Diet: Healthy  ? Exercise: Limited   ? Hobbies:  Reading  ?   ?   ?   ?   ? ?Social Determinants of Health  ? ?Financial Resource Strain: Not on file  ?Food Insecurity: Not on file  ?Transportation Needs: Not on file  ?Physical Activity: Not on file  ?Stress: Not on file   ?Social Connections: Not on file  ?  ? ?Family History:  The patient's family history includes Cervical cancer in her paternal grandmother; Diabetes in her maternal grandmother and paternal uncle; Hyp

## 2021-06-02 NOTE — Patient Instructions (Addendum)
Medication Instructions:  ?Your physician recommends that you continue on your current medications as directed. Please refer to the Current Medication list given to you today. ? ?*If you need a refill on your cardiac medications before your next appointment, please call your pharmacy* ? ?Lab Work: ?TODAY: BMET ?If you have labs (blood work) drawn today and your tests are completely normal, you will receive your results only by: ?MyChart Message (if you have MyChart) OR ?A paper copy in the mail ?If you have any lab test that is abnormal or we need to change your treatment, we will call you to review the results. ? ? ?Testing/Procedures: ?Your physician has requested that you have an echocardiogram. Echocardiography is a painless test that uses sound waves to create images of your heart. It provides your doctor with information about the size and shape of your heart and how well your heart?s chambers and valves are working. This procedure takes approximately one hour. There are no restrictions for this procedure. ? ?Your provider has requested that you have a coronary CTA scan. Please see next page for further information. ? ?Your physician has recommended that you wear an event monitor. Event monitors are medical devices that record the heart?s electrical activity. Doctors most often [place] these monitors to diagnose arrhythmias. Arrhythmias are problems with the speed or rhythm of the heartbeat. The monitor is a small, portable device. You can wear one while you do your normal daily activities. This is usually used to diagnose what is causing palpitations/syncope (passing out). ? ?Follow-Up: ?At [facility], you and your health needs are our priority.  As part of our continuing mission to provide you with exceptional heart care, we have created designated Provider Care Teams.  These Care Teams include your primary Cardiologist (physician) and Advanced Practice Providers (APPs -  Physician Assistants and Nurse  Practitioners) who all work together to provide you with the care you need, when you need it. ? ?Your next appointment:   ?1 year(s) ? ?The format for your next appointment:   ?In Person ? ?Provider:   ?Armanda Magic, MD   ? ?Other Instructions ? ? ?Your cardiac CT will be scheduled at:  ? ?[facility] ?[address] ?[place], [place] [address] ?[phone] ? ?If scheduled at [facility], please arrive at the [facility] Entrance (Entrance C2) of [facility] 30 minutes prior to test start time. ?You can use the FREE valet parking offered at entrance C (encouraged to control the heart rate for the test)  ?Proceed to the [facility] Radiology Department (first floor) to check-in and test prep. ? ?All radiology patients and guests should use entrance C2 at [facility], accessed from [facility], even though the hospital's physical address listed is [address] ? ? ? ?Please follow these instructions carefully (unless otherwise directed): ? ?On the Night Before the Test: ?Be sure to Drink plenty of water. ?Do not consume any caffeinated/decaffeinated beverages or chocolate 12 hours prior to your test. ?Do not take any antihistamines 12 hours prior to your test. ? ?On the Day of the Test: ?Drink plenty of water until 1 hour prior to the test. ?Do not eat any food 4 hours prior to the test. ?You may take your regular medications prior to the test.  ?Take metoprolol (Lopressor) two hours prior to test. ?FEMALES- please wear underwire-free bra if available, avoid dresses & tight clothing ?     ?After the Test: ?  Drink plenty of water. ?After receiving IV contrast, you may experience a mild flushed feeling. This is normal. ?On occasion, you may experience a mild rash up to 24 hours after the test. This is not dangerous. If this occurs, you can take Benadryl 25 mg and increase your fluid intake. ?If you experience trouble breathing, this can be serious.  If it is severe call 911 IMMEDIATELY. If it is mild, please call our office. ?If you take any of these medications: Glipizide/Metformin, Avandament, Glucavance, please do not take 48 hours after completing test unless otherwise instructed. ? ?We will call to schedule your test 2-4 weeks out understanding that some insurance companies will need an authorization prior to the service being performed.  ? ?For non-scheduling related questions, please contact the cardiac imaging nurse navigator should you have any questions/concerns: ?Rockwell Alexandria, Cardiac Imaging Nurse Navigator ?Larey Brick, Cardiac Imaging Nurse Navigator ?[facility] ?Direct Office Dial: [phone]  ? ?For scheduling needs, including cancellations and rescheduling, please call [place], [phone].  ? ? ? ? ? ? ?  ?

## 2021-06-03 LAB — BASIC METABOLIC PANEL
BUN/Creatinine Ratio: 5 — ABNORMAL LOW (ref 12–28)
BUN: 7 mg/dL — ABNORMAL LOW (ref 8–27)
CO2: 21 mmol/L (ref 20–29)
Calcium: 9.9 mg/dL (ref 8.7–10.3)
Chloride: 104 mmol/L (ref 96–106)
Creatinine, Ser: 1.39 mg/dL — ABNORMAL HIGH (ref 0.57–1.00)
Glucose: 122 mg/dL — ABNORMAL HIGH (ref 70–99)
Potassium: 4.3 mmol/L (ref 3.5–5.2)
Sodium: 142 mmol/L (ref 134–144)
eGFR: 42 mL/min/{1.73_m2} — ABNORMAL LOW (ref >59)

## 2021-06-05 ENCOUNTER — Other Ambulatory Visit: Payer: Self-pay | Admitting: *Deleted

## 2021-06-05 MED ORDER — METOPROLOL TARTRATE 100 MG PO TABS: ORAL_TABLET | ORAL | 0 refills | Status: DC

## 2021-06-06 ENCOUNTER — Other Ambulatory Visit (HOSPITAL_COMMUNITY): Payer: Self-pay | Admitting: Student

## 2021-06-06 ENCOUNTER — Other Ambulatory Visit: Payer: Self-pay | Admitting: Student

## 2021-06-06 DIAGNOSIS — R1310 Dysphagia, unspecified: Secondary | ICD-10-CM

## 2021-06-07 DIAGNOSIS — I2583 Coronary atherosclerosis due to lipid rich plaque: Secondary | ICD-10-CM

## 2021-06-07 DIAGNOSIS — R002 Palpitations: Secondary | ICD-10-CM | POA: Diagnosis not present

## 2021-06-07 DIAGNOSIS — I2510 Atherosclerotic heart disease of native coronary artery without angina pectoris: Secondary | ICD-10-CM | POA: Diagnosis not present

## 2021-06-07 DIAGNOSIS — R071 Chest pain on breathing: Secondary | ICD-10-CM

## 2021-06-07 DIAGNOSIS — I10 Essential (primary) hypertension: Secondary | ICD-10-CM | POA: Diagnosis not present

## 2021-06-07 DIAGNOSIS — R9431 Abnormal electrocardiogram [ECG] [EKG]: Secondary | ICD-10-CM

## 2021-06-07 DIAGNOSIS — R600 Localized edema: Secondary | ICD-10-CM

## 2021-06-07 DIAGNOSIS — Z9989 Dependence on other enabling machines and devices: Secondary | ICD-10-CM

## 2021-06-07 DIAGNOSIS — I951 Orthostatic hypotension: Secondary | ICD-10-CM

## 2021-06-07 DIAGNOSIS — G4733 Obstructive sleep apnea (adult) (pediatric): Secondary | ICD-10-CM

## 2021-06-08 ENCOUNTER — Telehealth: Payer: Self-pay | Admitting: *Deleted

## 2021-06-08 NOTE — Telephone Encounter (Signed)
Patient notified via my chart message to sue her unit more and improve her cpap compliance. ?

## 2021-06-08 NOTE — Telephone Encounter (Signed)
Down added to air view by [employer] compliance team. ? ?Malinda, Mccroskey ?05/09/2021 - 06/07/2021 ?Patient ID: [id number] ?DOB: May 21, 1955 ?Age: 66 years ?30.12 [place] ?An [place] ?Compliance Report ?Usage 05/09/2021 - 06/07/2021 ?Usage days 27/30 days (90%) ?>= 4 hours 18 days (60%) ?< 4 hours 9 days (30%) ?Usage hours 132 hours 18 minutes ?Average usage (total days) 4 hours 25 minutes ?Average usage (days used) 4 hours 54 minutes ?Median usage (days used) 4 hours 57 minutes ?Total used hours (value since last reset - 06/07/2021) 218 hours ?AirSense 11 AutoSet ?Serial number [id number] ?Mode CPAP ?Set pressure 11 cmH2O ?EPR Ramp Only ?EPR level 1 ?Therapy ?Leaks - L/min Median: 8.0 95th percentile: 27.3 Maximum: 82.6 ?Events per hour AI: 1.2 HI: 0.3 AHI: 1.5 ?Apnea Index Central: 0.1 Obstructive: 0.3 Unknown: 0.8 ?RERA Index 0.5 ?Cheyne-Stokes respiration (average duration per night) 0 minutes (0%) ?Usage - hours ?Printed on 06/08/2021 - [employer] AirView version 4.40.0-1.0 Page 1 of  ?

## 2021-06-08 NOTE — Telephone Encounter (Signed)
-----   Message from Theresia Majors, RN sent at 06/02/2021  3:02 PM EDT ----- ?Dr. Mayford Knife would like a download on this patient.  ?Thank you!! ? ?

## 2021-06-13 ENCOUNTER — Ambulatory Visit (HOSPITAL_COMMUNITY)
Admission: RE | Admit: 2021-06-13 | Discharge: 2021-06-13 | Disposition: A | Discharge status: Home / Self Care | Payer: Medicare Other | Source: Ambulatory Visit | Attending: Student | Admitting: Student

## 2021-06-13 ENCOUNTER — Other Ambulatory Visit (HOSPITAL_COMMUNITY): Payer: Self-pay | Admitting: Student

## 2021-06-13 DIAGNOSIS — R1310 Dysphagia, unspecified: Secondary | ICD-10-CM

## 2021-06-29 ENCOUNTER — Encounter: Payer: Self-pay | Admitting: Cardiology

## 2021-06-29 ENCOUNTER — Telehealth: Payer: Self-pay

## 2021-06-29 ENCOUNTER — Ambulatory Visit (HOSPITAL_COMMUNITY): Payer: Medicare Other | Attending: Cardiovascular Disease

## 2021-06-29 ENCOUNTER — Telehealth: Payer: Self-pay | Admitting: *Deleted

## 2021-06-29 DIAGNOSIS — I951 Orthostatic hypotension: Secondary | ICD-10-CM | POA: Insufficient documentation

## 2021-06-29 DIAGNOSIS — I2510 Atherosclerotic heart disease of native coronary artery without angina pectoris: Secondary | ICD-10-CM | POA: Diagnosis present

## 2021-06-29 DIAGNOSIS — R071 Chest pain on breathing: Secondary | ICD-10-CM | POA: Insufficient documentation

## 2021-06-29 DIAGNOSIS — R600 Localized edema: Secondary | ICD-10-CM | POA: Insufficient documentation

## 2021-06-29 DIAGNOSIS — I10 Essential (primary) hypertension: Secondary | ICD-10-CM

## 2021-06-29 DIAGNOSIS — R002 Palpitations: Secondary | ICD-10-CM | POA: Diagnosis present

## 2021-06-29 DIAGNOSIS — I2583 Coronary atherosclerosis due to lipid rich plaque: Secondary | ICD-10-CM | POA: Insufficient documentation

## 2021-06-29 DIAGNOSIS — G4733 Obstructive sleep apnea (adult) (pediatric): Secondary | ICD-10-CM | POA: Diagnosis present

## 2021-06-29 DIAGNOSIS — I77810 Thoracic aortic ectasia: Secondary | ICD-10-CM | POA: Insufficient documentation

## 2021-06-29 DIAGNOSIS — R9431 Abnormal electrocardiogram [ECG] [EKG]: Secondary | ICD-10-CM | POA: Diagnosis present

## 2021-06-29 DIAGNOSIS — Z9989 Dependence on other enabling machines and devices: Secondary | ICD-10-CM | POA: Diagnosis present

## 2021-06-29 DIAGNOSIS — R0602 Shortness of breath: Secondary | ICD-10-CM | POA: Diagnosis not present

## 2021-06-29 LAB — ECHOCARDIOGRAM COMPLETE
AR max vel: 2.54 cm2
AV Area VTI: 2.51 cm2
AV Area mean vel: 2.51 cm2
AV Mean grad: 5 mmHg
AV Peak grad: 9 mmHg
Ao pk vel: 1.5 m/s
Area-P 1/2: 4.64 cm2
S' Lateral: 2.6 cm

## 2021-06-29 NOTE — Telephone Encounter (Signed)
The patient has been notified of the result and verbalized understanding.  All questions (if any) were answered. Theresia Majors, RN 06/29/2021 3:57 PM

## 2021-06-29 NOTE — Telephone Encounter (Signed)
-----   Message from Sueanne Margarita, MD sent at 06/29/2021  3:43 PM EDT ----- Echo showed normal heart function with EF 60 to 65%, mild left atrial enlargement, trivial MR and mildly dilated ascending aorta 38 mm -repeat 2D echo in 1 year for dilated aorta

## 2021-06-29 NOTE — Telephone Encounter (Signed)
   Pre-operative Risk Assessment    Patient Name: Martha Nolan  DOB: Oct 19, 1955 MRN: [id number]      Request for Surgical Clearance    Procedure:   COLONOSCOPY/EGD  Date of Surgery:  Clearance TBD                                 Surgeon:  DR. Marin Comment Surgeon's Group or Practice Name:  [facility] Phone number:  [phone] Fax number:  [phone]   Type of Clearance Requested:   - Medical  - Pharmacy:  Hold Rivaroxaban (Xarelto) x 2 DAYS PRIOR   Type of Anesthesia:  Not Indicated (PROPOFOL?)   Additional requests/questions:    Jiles Prows   06/29/2021, 3:29 PM

## 2021-06-30 NOTE — Telephone Encounter (Signed)
I do not see any documented indication for pt to be on Xarelto therapy. We are not prescribing this. If pt is taking, clearance should come from managing provider.

## 2021-06-30 NOTE — Telephone Encounter (Signed)
   Primary Cardiologist: Armanda Magic, MD  Chart reviewed as part of pre-operative protocol coverage. Given past medical history and time since last visit, based on ACC/AHA guidelines, Martha Nolan would be at acceptable risk for the planned procedure without further cardiovascular testing.   Patient Xarelto was not prescribed by cardiology.  Recommendations for holding Xarelto will need to come from prescribing provider.  I will route this recommendation to the requesting party via Epic fax function and remove from pre-op pool.  Please call with questions.  Thomasene Ripple. Arrion Burruel NP-C    06/30/2021, 8:33 AM [facility] [address] Office [phone] Fax [phone]

## 2021-07-04 ENCOUNTER — Telehealth: Payer: Self-pay

## 2021-07-04 DIAGNOSIS — I471 Supraventricular tachycardia: Secondary | ICD-10-CM

## 2021-07-04 NOTE — Telephone Encounter (Signed)
-----   Message from Sueanne Margarita, MD sent at 07/04/2021  3:19 PM EDT ----- Heart monitor showed episodes of SVT up to 13 beats in a row up to 184 beats at a time and a rare extra heartbeat from the bottom of the heart.  No evidence of A-fib.  Cannot treat with beta-blocker or calcium channel blocker due to history of orthostatic hypotension.  Please refer to EP

## 2021-07-04 NOTE — Telephone Encounter (Signed)
The patient has been notified of the result and verbalized understanding.  All questions (if any) were answered. Antonieta Iba, RN 07/04/2021 4:38 PM  Referral has been placed

## 2021-07-06 ENCOUNTER — Telehealth: Payer: Self-pay | Admitting: Cardiology

## 2021-07-06 NOTE — Telephone Encounter (Signed)
Spoke with the patient and reviewed instructions for her CT scan. I have also sent them to the patient through MyChart. Advised her to review and reply with any additional questions.

## 2021-07-06 NOTE — Telephone Encounter (Signed)
Pt is having the CT Cardiac Morph on 06/05 and is requesting a call back to discuss the details of the appt and what to do before. Please advise.

## 2021-07-07 ENCOUNTER — Telehealth (HOSPITAL_COMMUNITY): Payer: Self-pay | Admitting: Emergency Medicine

## 2021-07-07 NOTE — Telephone Encounter (Signed)
Attempted to call patient regarding upcoming cardiac CT appointment. °Left message on voicemail with name and callback number °Craige Patel RN Navigator Cardiac Imaging °[facility] °[phone] Office °[phone] Cell ° °

## 2021-07-10 ENCOUNTER — Encounter (HOSPITAL_COMMUNITY): Payer: Self-pay

## 2021-07-10 ENCOUNTER — Ambulatory Visit (HOSPITAL_COMMUNITY)
Admission: RE | Admit: 2021-07-10 | Discharge: 2021-07-10 | Disposition: A | Discharge status: Home / Self Care | Payer: Medicare Other | Source: Ambulatory Visit | Attending: Cardiology | Admitting: Cardiology

## 2021-07-10 DIAGNOSIS — I2510 Atherosclerotic heart disease of native coronary artery without angina pectoris: Secondary | ICD-10-CM | POA: Insufficient documentation

## 2021-07-10 DIAGNOSIS — R071 Chest pain on breathing: Secondary | ICD-10-CM | POA: Diagnosis present

## 2021-07-10 DIAGNOSIS — R931 Abnormal findings on diagnostic imaging of heart and coronary circulation: Secondary | ICD-10-CM | POA: Insufficient documentation

## 2021-07-10 DIAGNOSIS — K449 Diaphragmatic hernia without obstruction or gangrene: Secondary | ICD-10-CM | POA: Insufficient documentation

## 2021-07-10 DIAGNOSIS — I517 Cardiomegaly: Secondary | ICD-10-CM | POA: Insufficient documentation

## 2021-07-10 MED ORDER — NITROGLYCERIN 0.4 MG SL SUBL
SUBLINGUAL_TABLET | SUBLINGUAL | Status: AC
  Filled 2021-07-10: qty 2

## 2021-07-10 MED ORDER — NITROGLYCERIN 0.4 MG SL SUBL
0.8000 mg | SUBLINGUAL_TABLET | Freq: Once | SUBLINGUAL | Status: AC
  Administered 2021-07-10: 0.8 mg via SUBLINGUAL

## 2021-07-10 MED ORDER — IOHEXOL 350 MG/ML SOLN
100.0000 mL | Freq: Once | INTRAVENOUS | Status: AC | PRN
  Administered 2021-07-10: 100 mL via INTRAVENOUS

## 2021-07-11 ENCOUNTER — Ambulatory Visit (HOSPITAL_BASED_OUTPATIENT_CLINIC_OR_DEPARTMENT_OTHER)
Admission: RE | Admit: 2021-07-11 | Discharge: 2021-07-11 | Disposition: A | Discharge status: Home / Self Care | Payer: Medicare Other | Source: Ambulatory Visit | Attending: Internal Medicine | Admitting: Internal Medicine

## 2021-07-11 ENCOUNTER — Other Ambulatory Visit: Payer: Self-pay | Admitting: Internal Medicine

## 2021-07-11 ENCOUNTER — Ambulatory Visit (HOSPITAL_COMMUNITY)
Admission: RE | Admit: 2021-07-11 | Discharge: 2021-07-11 | Disposition: A | Discharge status: Home / Self Care | Payer: Medicare Other | Source: Ambulatory Visit | Attending: Internal Medicine | Admitting: Internal Medicine

## 2021-07-11 ENCOUNTER — Encounter: Payer: Self-pay | Admitting: Cardiology

## 2021-07-11 ENCOUNTER — Telehealth: Payer: Self-pay | Admitting: *Deleted

## 2021-07-11 DIAGNOSIS — R931 Abnormal findings on diagnostic imaging of heart and coronary circulation: Secondary | ICD-10-CM

## 2021-07-11 DIAGNOSIS — I2510 Atherosclerotic heart disease of native coronary artery without angina pectoris: Secondary | ICD-10-CM | POA: Insufficient documentation

## 2021-07-11 NOTE — Telephone Encounter (Signed)
I spoke with patient and reviewed results with her.  She is due to have endoscopy and colonoscopy soon and will call office tomorrow to schedule this.  She sees Dr Conley Rolls at [facility].   I called Dr Irwin Brakeman office and left message on nurse line with results and need for appointment.  Left message to call if any questions.  Will route CT result to Dr Conley Rolls.

## 2021-07-11 NOTE — Telephone Encounter (Signed)
-----   Message from Quintella Reichert, MD sent at 07/10/2021  5:16 PM EDT ----- Chest CT portion of cardiac CT showed thickened distal esophagus possible esophagitis with small hiatal hernia.  Please get patient in with GI or PCP ASAP

## 2021-07-12 ENCOUNTER — Telehealth: Payer: Self-pay | Admitting: Cardiology

## 2021-07-12 ENCOUNTER — Other Ambulatory Visit: Payer: Self-pay | Admitting: *Deleted

## 2021-07-12 DIAGNOSIS — E7800 Pure hypercholesterolemia, unspecified: Secondary | ICD-10-CM

## 2021-07-12 DIAGNOSIS — R931 Abnormal findings on diagnostic imaging of heart and coronary circulation: Secondary | ICD-10-CM

## 2021-07-12 MED ORDER — METOPROLOL SUCCINATE ER 25 MG PO TB24: 25.0000 mg | ORAL_TABLET | Freq: Every day | ORAL | 3 refills | Status: AC

## 2021-07-12 NOTE — Telephone Encounter (Signed)
   Pre-operative Risk Assessment    Patient Name: Martha Nolan  DOB: 26-May-1955 MRN: [id number]      Request for Surgical Clearance    Procedure:   Colonoscopy and a EGD  Date of Surgery:  Clearance TBD                                 Surgeon:  Dr. Conley Rolls Surgeon's Group or Practice Name:  [facility] Phone number:  [phone] Fax number:  [phone]   Type of Clearance Requested:   - Pharmacy:  Hold Rivaroxaban (Xarelto) If okay to hold for 2 days and what is the risk factor of holding that medication.    Type of Anesthesia:   Propofol   Additional requests/questions:      Emmaline Kluver   07/12/2021, 9:20 AM

## 2021-07-13 NOTE — Telephone Encounter (Signed)
    Patient Name: Martha Nolan  DOB: 1955/07/28 MRN: [id number]  Primary Cardiologist: Fransico Him, MD  Chart reviewed as part of pre-operative protocol coverage.  Patient's Xarelto is not prescribed by cardiology.  Recommendations for holding Xarelto will need to come from the prescribing provider.  I will route this recommendation to the requesting party via Epic fax function and remove from pre-op pool.  Please call with questions.  Lenna Sciara, NP 07/13/2021, 9:54 AM

## 2021-07-27 ENCOUNTER — Telehealth: Payer: Self-pay | Admitting: Cardiology

## 2021-07-27 ENCOUNTER — Encounter: Payer: Self-pay | Admitting: Internal Medicine

## 2021-07-27 ENCOUNTER — Ambulatory Visit (INDEPENDENT_AMBULATORY_CARE_PROVIDER_SITE_OTHER): Payer: Medicare Other | Admitting: Internal Medicine

## 2021-07-27 DIAGNOSIS — R002 Palpitations: Secondary | ICD-10-CM | POA: Diagnosis not present

## 2021-07-27 NOTE — Patient Instructions (Signed)
Medication Instructions:  ?Your physician recommends that you continue on your current medications as directed. Please refer to the Current Medication list given to you today. ? ?Labwork: ?None ordered. ? ?Testing/Procedures: ?None ordered. ? ?Follow-Up: ?Your physician wants you to follow-up in: as needed ? ?Any Other Special Instructions Will Be Listed Below (If Applicable). ? ?If you need a refill on your cardiac medications before your next appointment, please call your pharmacy.  ? ?Important Information About Sugar ? ? ? ? ? ? ? ?

## 2021-07-27 NOTE — Telephone Encounter (Signed)
   Pre-operative Risk Assessment    Patient Name: Martha Nolan  DOB: Nov 05, 1955 MRN: [id number]      Request for Surgical Clearance    Procedure:   Interlaminar Lumbar Epidural Injection  Date of Surgery:  Clearance 08/02/21                                 Surgeon:  Dr. Hetty Ely Surgeon's Group or Practice Name:  [facility] Phone number:  [phone] Fax number:  [phone]   Type of Clearance Requested:   - Pharmacy:  Hold Rivaroxaban (Xarelto) for 3 days prior   Type of Anesthesia:  None    Additional requests/questions:  Please fax a copy of medical clearance to the surgeon's office.  Norva Pavlov Pough   07/27/2021, 8:25 AM

## 2021-07-27 NOTE — Progress Notes (Signed)
HPI Martha Nolan is referred today for evaluation of NS SVT. She is a pleasant 66 yo morbidly obese woman with a h/o palpitations and asthma. She has a h/o neuropathy. Mostly her palpitations are short lived lasting seconds but at other times 5-10 minutes. She has a fitbit and her HR's have been in the 100's. She admits to dietary indiscretion with sugar sweetened drinks. She has several [employer].  Allergies  Allergen Reactions   Penicillins Hives     Current Outpatient Medications  Medication Sig Dispense Refill   albuterol (PROVENTIL HFA;VENTOLIN HFA) 108 (90 BASE) MCG/ACT inhaler Inhale 1 puff into the lungs every 6 (six) hours as needed for wheezing or shortness of breath.     allopurinol (ZYLOPRIM) 100 MG tablet Take 100 mg by mouth daily.     aspirin 81 MG chewable tablet Chew 81 mg by mouth daily.     aspirin-acetaminophen-caffeine (EXCEDRIN MIGRAINE) 250-250-65 MG tablet Take 1-2 tablets by mouth every 6 (six) hours as needed for headache.     azelastine (ASTELIN) 0.1 % nasal spray Place 2 sprays into both nostrils 2 (two) times daily.     B Complex-C (SUPER B COMPLEX PO) Take 1 tablet by mouth 2 (two) times a week.     BELBUCA 600 MCG FILM in the morning and at bedtime.     buPROPion (WELLBUTRIN XL) 300 MG 24 hr tablet Take 300 mg by mouth daily.     cetirizine (ZYRTEC) 10 MG tablet Take 10 mg by mouth daily.     Cholecalciferol (D3 5000) 125 MCG (5000 UT) capsule Take 5,000 Units by mouth daily.     Choline Fenofibrate (FENOFIBRIC ACID) 135 MG CPDR Take 135 mg by mouth daily.     Chromium 1000 MCG TABS Take 1,000 mcg by mouth daily.     CRANBERRY PO Take 1 tablet by mouth 2 (two) times daily.     docusate sodium (COLACE) 100 MG capsule Take 200 mg by mouth at bedtime.     DULoxetine (CYMBALTA) 60 MG capsule Take 60 mg by mouth daily.     famotidine (PEPCID) 40 MG tablet Take 40 mg by mouth at bedtime.     ferrous sulfate 325 (65 FE) MG tablet Take 325 mg by mouth See  admin instructions. 325mg  daily on Tuesday Thursday Saturday Sunday     Folic Acid (FOLATE PO) Take 1 tablet by mouth daily.     Garlic 1000 MG CAPS Take 1,000 mg by mouth 2 (two) times daily.     Ginger, Zingiber officinalis, (GINGER ROOT) 550 MG CAPS Take 550 mg by mouth daily.     JANUVIA 50 MG tablet Take 50 mg by mouth daily.     ketoconazole (NIZORAL) 2 % cream Apply topically in the morning and at bedtime.     metoprolol succinate (TOPROL-XL) 25 MG 24 hr tablet Take 1 tablet (25 mg total) by mouth daily. 90 tablet 3   Multiple Vitamins-Minerals (MULTIVITAMIN WITH MINERALS) tablet Take 1 tablet by mouth 2 (two) times a week.     nystatin (MYCOSTATIN/NYSTOP) powder Apply 1 application topically 4 (four) times daily as needed (yeast rash).     OVER THE COUNTER MEDICATION Take 2 tablets by mouth at bedtime. Gentle Laxitive     pantoprazole (PROTONIX) 40 MG tablet Take 40 mg by mouth 2 (two) times daily.     pregabalin (LYRICA) 100 MG capsule Take 100 mg by mouth 3 (three) times daily.  rosuvastatin (CRESTOR) 20 MG tablet Take 20 mg by mouth at bedtime.     Turmeric 500 MG CAPS Take 500 mg by mouth daily.     vitamin E 180 MG (400 UNITS) capsule Take 400 Units by mouth 2 (two) times daily.     XARELTO 20 MG TABS tablet Take 20 mg by mouth daily.     potassium chloride SA (K-DUR,KLOR-CON) 20 MEQ tablet Take 1 tablet (20 mEq total) by mouth 2 (two) times daily. 180 tablet 3   No current facility-administered medications for this visit.     Past Medical History:  Diagnosis Date   Anxiety    Ascending aorta dilatation (HCC)    38 cm by 2D echo 06/2021   Asthma    Barrett's esophagus    Dr. Nedra Hai (EGD - 03/2013) Q 3 years    CAD (coronary artery disease), native coronary artery    Coronary CTA showed high coronary Ca score of 519 with 25-49% proximal and 50-69% distal RCA lesions, <25% distal LM, 50-69% proximal LAD, 25-49% mid LAD and 70-99% in proximal D1, < 25% proximal LCx.  Normal FFR  07/2021   Chronic back pain    Chronic pain    Low Back L4-S1   Depression    Diabetes mellitus without complication (HCC)    GERD (gastroesophageal reflux disease)    Hypertension    Internal hemorrhoids    Dr. Conley Rolls (Colon   Migraine    Sleep apnea    Spinal stenosis     ROS:   All systems reviewed and negative except as noted in the HPI.   Past Surgical History:  Procedure Laterality Date   CARPAL TUNNEL RELEASE     CESAREAN SECTION     CESAREAN SECTION     CHOLECYSTECTOMY     DILATION AND CURETTAGE OF UTERUS  11/05/2012   ULNAR NERVE REPAIR       Family History  Problem Relation Age of Onset   Hypertension Mother    Hypertension Father    Hypertension Maternal Grandmother    Diabetes Maternal Grandmother    Hypertension Maternal Grandfather    Cervical cancer Paternal Grandmother    Hypertension Paternal Grandmother    Hypertension Paternal Grandfather    Diabetes Paternal Uncle      Social History   Socioeconomic History   Marital status: Single    Spouse name: Not on file   Number of children: 1   Years of education: 12   Highest education level: Not on file  Occupational History   Occupation: DISABLED   Tobacco Use   Smoking status: Former    Packs/day: 0.50    Years: 35.00    Total pack years: 17.50    Types: Cigarettes, E-cigarettes    Quit date: 02/05/2013    Years since quitting: 8.4   Smokeless tobacco: Not on file  Substance and Sexual Activity   Alcohol use: No   Drug use: No   Sexual activity: Yes    Partners: Female  Other Topics Concern   Not on file  Social History Narrative   ** Merged History Encounter **       Marital Status: She has a Media planner (Midwife)   Children:  Daughter Vernona Rieger)    Pets: Dogs (2), Cats (2)     Living Situation:  Lives with domestic partner and children.   Occupation:  Disabled     EducationEngineer, petroleum    Tobacco Use/Exposure:  She quit smoking in 1/15.     Alcohol Use:  No    Drug Use: No   Diet: Healthy   Exercise: Limited    Hobbies:  Reading               Social Determinants of Corporate investment banker Strain: Not on file  Food Insecurity: Not on file  Transportation Needs: Not on file  Physical Activity: Not on file  Stress: Not on file  Social Connections: Not on file  Intimate Partner Violence: Not on file     Ht 5\' 5"  (1.651 m)   BMI 44.76 kg/m   Physical Exam:  obese appearing NAD HEENT: Unremarkable Neck:  No JVD, no thyromegally Lymphatics:  No adenopathy Back:  No CVA tenderness Lungs:  Clear with no wheezes HEART:  Regular rate rhythm, no murmurs, no rubs, no clicks Abd:  soft, positive bowel sounds, no organomegally, no rebound, no guarding Ext:  2 plus pulses, no edema, no cyanosis, no clubbing Skin:  No rashes no nodules Neuro:  CN II through XII intact, motor grossly intact  EKG - reviewed. NSR   Assess/Plan:  SVT - her cardiac monitor is reassuring with only brief runs of NS SVT. I would recommend watchful waiting. No AA drug is recommended Morbid obesity - she is encouraged to avoid sugar sweetened beverages as well as caffeine. I asked her to stop drinking . HTN - her bp is controlled.   [employer] Martha Paredez,MD

## 2021-07-28 NOTE — Telephone Encounter (Signed)
RN Vernona Rieger is calling back to check on the status of the clearance for pt procedure on 08/01/21.  Please advise.

## 2021-08-05 ENCOUNTER — Other Ambulatory Visit: Payer: Self-pay | Admitting: Cardiology

## 2021-08-16 ENCOUNTER — Other Ambulatory Visit: Payer: Self-pay | Admitting: Cardiology

## 2021-08-17 ENCOUNTER — Institutional Professional Consult (permissible substitution): Payer: Medicare Other | Admitting: Internal Medicine

## 2021-08-25 ENCOUNTER — Encounter: Payer: Self-pay | Admitting: Cardiology

## 2021-08-25 ENCOUNTER — Ambulatory Visit (INDEPENDENT_AMBULATORY_CARE_PROVIDER_SITE_OTHER): Payer: Medicare Other | Admitting: Cardiology

## 2021-08-25 VITALS — BP 120/72 | HR 72 | Ht 65.0 in | Wt 262.0 lb

## 2021-08-25 DIAGNOSIS — I10 Essential (primary) hypertension: Secondary | ICD-10-CM

## 2021-08-25 DIAGNOSIS — I471 Supraventricular tachycardia: Secondary | ICD-10-CM

## 2021-08-25 DIAGNOSIS — I2510 Atherosclerotic heart disease of native coronary artery without angina pectoris: Secondary | ICD-10-CM | POA: Diagnosis not present

## 2021-08-25 DIAGNOSIS — R600 Localized edema: Secondary | ICD-10-CM | POA: Diagnosis not present

## 2021-08-25 DIAGNOSIS — E7800 Pure hypercholesterolemia, unspecified: Secondary | ICD-10-CM

## 2021-08-25 NOTE — Patient Instructions (Signed)
Medication Instructions:  Your physician recommends that you continue on your current medications as directed. Please refer to the Current Medication list given to you today.  *If you need a refill on your cardiac medications before your next appointment, please call your pharmacy*  Lab Work: Fasting labs and ALT  If you have labs (blood work) drawn today and your tests are completely normal, you will receive your results only by: MyChart Message (if you have MyChart) OR A paper copy in the mail If you have any lab test that is abnormal or we need to change your treatment, we will call you to review the results.  Follow-Up: At [facility], you and your health needs are our priority.  As part of our continuing mission to provide you with exceptional heart care, we have created designated Provider Care Teams.  These Care Teams include your primary Cardiologist (physician) and Advanced Practice Providers (APPs -  Physician Assistants and Nurse Practitioners) who all work together to provide you with the care you need, when you need it.  Your next appointment:   6 month(s)  The format for your next appointment:   In Person  Provider:   Armanda Magic, MD    Important Information About Sugar

## 2021-08-25 NOTE — Progress Notes (Signed)
Cardiology Note    Date:  08/25/2021   ID:  Martha Nolan, DOB [birth year], MRN [id number]  PCP:  Hillery Aldo, NP  Cardiologist:  Armanda Magic, MD   Chief Complaint  Patient presents with   Coronary Artery Disease   Hypertension   Hyperlipidemia   Palpitations    History of Present Illness:  Martha Nolan is a 66 y.o. female with PMH significant for morbid obesity, OSA on CPAP, DM2, HTN, GERD, ASCAD, CKD stage 3b, OSA on CPAP, DM2, chronic CHF (type unknown), Barrett's esophagus, migraine, asthma, anxiety/depression, chronic back pain, spinal stenosis.  She has a hx of SVT noted on event monitor in May 2023 and is followed by EP.    She has chronic DOE related to her COPD and asthma and follows with a Pulmonologist. She has chronic LE edema and her Lasix was stopped due to hypotension.    I saw her back in April as a referral back from her PCP due to an abnormal EKG but the EKG was actually normal except for heart rate in the mid 50s but she was asymptomatic.  Because of her cardiac risk factors A coronary CTA was done which showed three-vessel CAD with less than 25% left main, 25 to 49% proximal RC LAD and PDA, 50 to 69% mid to distal RCA, 50 to 69% proximal LAD, 25 to 49% mid LAD and 70 to 99% proximal D1.  Coronary FFR showed no significant flow-limiting lesions.  She was started on Toprol-XL 25 mg daily.  Due to a dilated pulmonary artery noted on CT a repeat echo was done which showed normal LV function with EF 60 to 65% with mild left atrial enlargement, trivial MR, mildly dilated ascending aorta at 38 mm but no evidence of pulmonary artery enlargement.  She is here today for followup and is doing well.  She denies any chest pain or pressure, PND, orthopnea, LE edema, dizziness or syncope. Occasionally she has some mild palpitations. She has chronic DOE related to her COPD that has been somewhat worse recently related to humid heat.  She is compliant with her meds and is tolerating  meds with no SE.     Past Medical History:  Diagnosis Date   Anxiety    Ascending aorta dilatation (HCC)    38 cm by 2D echo 06/2021   Asthma    Barrett's esophagus    Dr. Nedra Hai (EGD - 03/2013) Q 3 years    CAD (coronary artery disease), native coronary artery    Coronary CTA showed high coronary Ca score of 519 with 25-49% proximal and 50-69% distal RCA lesions, <25% distal LM, 50-69% proximal LAD, 25-49% mid LAD and 70-99% in proximal D1, < 25% proximal LCx.  Normal FFR 07/2021   Chronic back pain    Chronic pain    Low Back L4-S1   Depression    Diabetes mellitus without complication (HCC)    GERD (gastroesophageal reflux disease)    Hypertension    Internal hemorrhoids    Dr. Conley Rolls (Colon   Migraine    Sleep apnea    Spinal stenosis     Past Surgical History:  Procedure Laterality Date   CARPAL TUNNEL RELEASE     CESAREAN SECTION     CESAREAN SECTION     CHOLECYSTECTOMY     DILATION AND CURETTAGE OF UTERUS  11/05/2012   ULNAR NERVE REPAIR      Current Medications: Current Meds  Medication Sig   albuterol (PROVENTIL HFA;VENTOLIN  HFA) 108 (90 BASE) MCG/ACT inhaler Inhale 2 puffs into the lungs every 6 (six) hours as needed for wheezing or shortness of breath.   allopurinol (ZYLOPRIM) 100 MG tablet Take 100 mg by mouth daily.   azelastine (ASTELIN) 0.1 % nasal spray Place 2 sprays into both nostrils 2 (two) times daily.   BELBUCA 600 MCG FILM in the morning and at bedtime.   buPROPion (WELLBUTRIN XL) 300 MG 24 hr tablet Take 300 mg by mouth daily.   Choline Fenofibrate (FENOFIBRIC ACID) 135 MG CPDR Take 135 mg by mouth daily.   docusate sodium (COLACE) 100 MG capsule Take 200 mg by mouth at bedtime.   DULoxetine (CYMBALTA) 60 MG capsule Take 60 mg by mouth daily.   famotidine (PEPCID) 40 MG tablet Take 40 mg by mouth at bedtime.   JANUVIA 50 MG tablet Take 50 mg by mouth daily.   ketoconazole (NIZORAL) 2 % cream Apply topically in the morning and at bedtime.   metoprolol  succinate (TOPROL-XL) 25 MG 24 hr tablet Take 1 tablet (25 mg total) by mouth daily.   nystatin (MYCOSTATIN/NYSTOP) powder Apply 1 application topically 4 (four) times daily as needed (yeast rash).   pantoprazole (PROTONIX) 40 MG tablet Take 40 mg by mouth 2 (two) times daily.   potassium chloride SA (K-DUR,KLOR-CON) 20 MEQ tablet Take 1 tablet (20 mEq total) by mouth 2 (two) times daily.   pregabalin (LYRICA) 100 MG capsule Take 100 mg by mouth 4 (four) times daily.   rosuvastatin (CRESTOR) 20 MG tablet Take 20 mg by mouth at bedtime.    Allergies:   Penicillins   Social History   Socioeconomic History   Marital status: Single    Spouse name: Not on file   Number of children: 1   Years of education: 12   Highest education level: Not on file  Occupational History   Occupation: DISABLED   Tobacco Use   Smoking status: Former    Packs/day: 0.50    Years: 35.00    Total pack years: 17.50    Types: Cigarettes, E-cigarettes    Quit date: 02/05/2013    Years since quitting: 8.5   Smokeless tobacco: Not on file  Substance and Sexual Activity   Alcohol use: No   Drug use: No   Sexual activity: Yes    Partners: Female  Other Topics Concern   Not on file  Social History Narrative   ** Merged History Encounter **       Marital Status: She has a Soil scientist (Transport planner)   Children:  Daughter Mickel Baas)    Pets: Dogs (2), Cats (2)     Living Situation:  Lives with domestic partner and children.   Occupation:  Disabled     Education:  [employer]    Tobacco Use/Exposure:  She quit smoking in 1/15.     Alcohol Use:  No   Drug Use: No   Diet: Healthy   Exercise: Limited    Hobbies:  Reading               Social Determinants of Radio broadcast assistant Strain: Not on file  Food Insecurity: Not on file  Transportation Needs: Not on file  Physical Activity: Not on file  Stress: Not on file  Social Connections: Not on file     Family History:  The patient's  family history includes Cervical cancer in her paternal grandmother; Diabetes in her maternal grandmother and paternal uncle; Hypertension  in her father, maternal grandfather, maternal grandmother, mother, paternal grandfather, and paternal grandmother.   ROS:   Please see the history of present illness.    ROS All other systems reviewed and are negative.      No data to display             PHYSICAL EXAM:   VS:  BP 120/72   Pulse 72   Ht 5\' 5"  (1.651 m)   Wt 262 lb (118.8 kg)   BMI 43.60 kg/m    GEN: Well nourished, well developed in no acute distress HEENT: Normal NECK: No JVD; No carotid bruits LYMPHATICS: No lymphadenopathy CARDIAC:RRR, no murmurs, rubs, gallops RESPIRATORY:  Clear to auscultation without rales, wheezing or rhonchi  ABDOMEN: Soft, non-tender, non-distended MUSCULOSKELETAL:  No edema; No deformity  SKIN: Warm and dry NEUROLOGIC:  Alert and oriented x 3 PSYCHIATRIC:  Normal affect   Wt Readings from Last 3 Encounters:  08/25/21 262 lb (118.8 kg)  07/27/21 262 lb 12.8 oz (119.2 kg)  06/02/21 269 lb (122 kg)      Studies/Labs Reviewed:   EKG:  EKG is not ordered today.    Recent Labs: 03/22/2021: TSH 2.025 03/23/2021: ALT 27; Hemoglobin 11.5; Platelets 254 06/02/2021: BUN 7; Creatinine, Ser 1.39; Potassium 4.3; Sodium 142   Lipid Panel    Component Value Date/Time   CHOL 186 06/17/2013 1151   TRIG 229 (H) 06/17/2013 1151   HDL 38 (L) 06/17/2013 1151   CHOLHDL 4.9 06/17/2013 1151   VLDL 46 (H) 06/17/2013 1151   LDLCALC 102 (H) 06/17/2013 1151      Additional studies/ records that were reviewed today include:  OV notes from PCP    ASSESSMENT:    1. Essential hypertension, benign   2. Bilateral leg edema   3. Coronary artery disease involving native coronary artery of native heart without angina pectoris   4. SVT (supraventricular tachycardia) ([place])   5. Pure hypercholesterolemia      PLAN:  In order of problems listed  above:  HTN -BP is well controlled on exam today -She had been on losartan and Lasix prior to her hospitalization in February for hypotension and those have been stopped with blood pressure remaining stable -She is now on Toprol-XL 25 mg daily for her CAD and blood pressure is tolerating well  Chronic LE edema -2D echo May 2023 showed normal LV function -She does not appear volume overloaded on exam today although she says intermittently she will have some lower extremity edema -Encouraged her to follow less than 2 g sodium diet -Continue compression hose during the day  ASCAD/Abnormal EKG  -her EKG was actually normal at PCP office except for HR in the mid 50's but asymptomatic -she does have low voltage likely related to obesity -2D echo showed normal LV function -Coronary CTA showed three-vessel CAD with less than 25% left main, 25 to 49% proximal RC LAD and PDA, 50 to 69% mid to distal RCA, 50 to 69% proximal LAD, 25 to 49% mid LAD and 70 to 99% proximal D1.  Coronary FFR showed no significant flow-limiting lesions.   -She denies any anginal symptoms -Continue aspirin 81 mg daily, Toprol-XL 25 mg daily, statin therapy  SVT/palpitations -2-week Zio patch showed episodes of SVT -She was seen by EP in May and recommended watchful waiting -Continue Toprol-XL 25 mg daily  Hyperlipidemia -LDL goal less than 70 -Check FLP and ALT -Continue Crestor 20 mg daily and fenofibrate 135mg  daily with as needed  refills  Followup with me in 6 months  Time Spent: 20 minutes total time of encounter, including 15 minutes spent in face-to-face patient care on the date of this encounter. This time includes coordination of care and counseling regarding above mentioned problem list. Remainder of non-face-to-face time involved reviewing chart documents/testing relevant to the patient encounter and documentation in the medical record. I have independently reviewed documentation from referring  provider  Medication Adjustments/Labs and Tests Ordered: Current medicines are reviewed at length with the patient today.  Concerns regarding medicines are outlined above.  Medication changes, Labs and Tests ordered today are listed in the Patient Instructions below.  There are no Patient Instructions on file for this visit.   Signed, Armanda Magic, MD  08/25/2021 1:24 PM    [facility] [address] [place], [place], [place]  [address] Phone: [phone]; Fax: [phone]

## 2021-08-28 ENCOUNTER — Other Ambulatory Visit: Payer: Medicare Other

## 2022-01-02 ENCOUNTER — Other Ambulatory Visit: Payer: Self-pay

## 2022-01-02 ENCOUNTER — Ambulatory Visit
Admission: RE | Admit: 2022-01-02 | Discharge: 2022-01-02 | Disposition: A | Discharge status: Home / Self Care | Payer: Medicare Other | Source: Ambulatory Visit | Attending: Student | Admitting: Student

## 2022-01-02 DIAGNOSIS — Z9181 History of falling: Secondary | ICD-10-CM

## 2022-02-07 ENCOUNTER — Other Ambulatory Visit: Payer: Self-pay

## 2022-02-07 ENCOUNTER — Emergency Department (HOSPITAL_BASED_OUTPATIENT_CLINIC_OR_DEPARTMENT_OTHER): Admit: 2022-02-07 | Discharge: 2022-02-07 | Disposition: A | Discharge status: Home / Self Care | Payer: Medicare Other

## 2022-02-07 ENCOUNTER — Emergency Department (HOSPITAL_COMMUNITY)
Admission: EM | Admit: 2022-02-07 | Discharge: 2022-02-07 | Discharge status: Against Medical Advice | Payer: Medicare Other | Attending: Student | Admitting: Student

## 2022-02-07 DIAGNOSIS — M79605 Pain in left leg: Secondary | ICD-10-CM | POA: Insufficient documentation

## 2022-02-07 DIAGNOSIS — Z5321 Procedure and treatment not carried out due to patient leaving prior to being seen by health care provider: Secondary | ICD-10-CM | POA: Diagnosis not present

## 2022-02-07 DIAGNOSIS — M7989 Other specified soft tissue disorders: Secondary | ICD-10-CM | POA: Diagnosis not present

## 2022-02-07 NOTE — ED Triage Notes (Addendum)
Pt reports left lower leg swelling and pain x 6 weeks hx blood clots.

## 2022-02-07 NOTE — ED Provider Triage Note (Signed)
Emergency Medicine Provider Triage Evaluation Note  Martha Nolan , a 67 y.o. female  was evaluated in triage.  Pt complains of 6 weeks of swelling to the left lower extremity.  History of DVT.  No provoking factors.  Not on thinners  Review of Systems  Positive:  Negative:   Physical Exam  BP 133/75 (BP Location: Left Arm)   Pulse 72   Temp 98.8 F (37.1 C) (Oral)   Resp 16   SpO2 98%  Gen:   Awake, no distress   Resp:  Normal effort  MSK:   Moves extremities without difficulty  Other:  Visible edema of the left lower extremity when compared to the right.  Also erythematous and warm big toe.  Strong DP pulse, full range of motion of all joints  Medical Decision Making  Medically screening exam initiated at 12:50 PM.  Appropriate orders placed.  [name] was informed that the remainder of the evaluation will be completed by another provider, this initial triage assessment does not replace that evaluation, and the importance of remaining in the ED until their evaluation is complete.     Rhae Hammock, PA-C 02/07/22 1251

## 2022-02-07 NOTE — Progress Notes (Signed)
Left lower extremity venous duplex has been completed. Preliminary results can be found in CV Proc through chart review.  Results were given to [place] PA.  02/07/22 1:31 PM Carlos Levering RVT

## 2022-02-15 ENCOUNTER — Other Ambulatory Visit (HOSPITAL_COMMUNITY): Payer: Self-pay | Admitting: Student

## 2022-02-15 ENCOUNTER — Ambulatory Visit (HOSPITAL_COMMUNITY)
Admission: RE | Admit: 2022-02-15 | Discharge: 2022-02-15 | Disposition: A | Discharge status: Home / Self Care | Payer: Medicare Other | Source: Ambulatory Visit | Attending: Student | Admitting: Student

## 2022-02-15 DIAGNOSIS — J441 Chronic obstructive pulmonary disease with (acute) exacerbation: Secondary | ICD-10-CM

## 2022-02-16 ENCOUNTER — Other Ambulatory Visit: Payer: Self-pay | Admitting: Student

## 2022-02-16 DIAGNOSIS — R748 Abnormal levels of other serum enzymes: Secondary | ICD-10-CM

## 2022-02-26 ENCOUNTER — Other Ambulatory Visit: Payer: Self-pay | Admitting: *Deleted

## 2022-02-26 DIAGNOSIS — M7989 Other specified soft tissue disorders: Secondary | ICD-10-CM

## 2022-02-28 ENCOUNTER — Ambulatory Visit (HOSPITAL_COMMUNITY): Payer: 59

## 2022-03-01 ENCOUNTER — Encounter: Payer: Medicare Other | Admitting: Vascular Surgery

## 2022-03-12 ENCOUNTER — Ambulatory Visit
Admission: RE | Admit: 2022-03-12 | Discharge: 2022-03-12 | Disposition: A | Discharge status: Home / Self Care | Payer: 59 | Source: Ambulatory Visit | Attending: Student | Admitting: Student

## 2022-03-12 DIAGNOSIS — R748 Abnormal levels of other serum enzymes: Secondary | ICD-10-CM

## 2022-06-07 ENCOUNTER — Ambulatory Visit (HOSPITAL_COMMUNITY)
Admission: RE | Admit: 2022-06-07 | Discharge: 2022-06-07 | Disposition: A | Discharge status: Home / Self Care | Payer: 59 | Source: Ambulatory Visit | Attending: Cardiology | Admitting: Cardiology

## 2022-06-07 DIAGNOSIS — E119 Type 2 diabetes mellitus without complications: Secondary | ICD-10-CM | POA: Insufficient documentation

## 2022-06-07 DIAGNOSIS — G4730 Sleep apnea, unspecified: Secondary | ICD-10-CM | POA: Insufficient documentation

## 2022-06-07 DIAGNOSIS — I77810 Thoracic aortic ectasia: Secondary | ICD-10-CM

## 2022-06-07 DIAGNOSIS — I2510 Atherosclerotic heart disease of native coronary artery without angina pectoris: Secondary | ICD-10-CM | POA: Insufficient documentation

## 2022-06-07 DIAGNOSIS — K219 Gastro-esophageal reflux disease without esophagitis: Secondary | ICD-10-CM | POA: Insufficient documentation

## 2022-06-07 DIAGNOSIS — I10 Essential (primary) hypertension: Secondary | ICD-10-CM | POA: Diagnosis not present

## 2022-06-07 LAB — ECHOCARDIOGRAM COMPLETE
Area-P 1/2: 2.87 cm2
Calc EF: 57.4 %
S' Lateral: 3.1 cm
Single Plane A2C EF: 58.5 %
Single Plane A4C EF: 56.9 %

## 2022-06-07 NOTE — Progress Notes (Signed)
Echocardiogram 2D Echocardiogram has been performed.  Martha Nolan 06/07/2022, 2:51 PM

## 2022-08-10 ENCOUNTER — Ambulatory Visit
Admission: RE | Admit: 2022-08-10 | Discharge: 2022-08-10 | Disposition: A | Discharge status: Home / Self Care | Payer: 59 | Source: Ambulatory Visit | Attending: Student | Admitting: Student

## 2022-08-10 ENCOUNTER — Other Ambulatory Visit: Payer: Self-pay | Admitting: Student

## 2022-08-10 DIAGNOSIS — M25511 Pain in right shoulder: Secondary | ICD-10-CM

## 2022-08-24 IMAGING — RF DG ESOPHAGUS
10 of 12 series · 14 of 24 positions shown · non-contrast
Comparison: None Available.

CLINICAL DATA: Sensation of throat during meals. Known
gastroesophageal reflux disease with diagnosis of Barrett's
esophagus. Compliant with pantoprazole.

EXAM:
ESOPHAGUS/BARIUM SWALLOW/TABLET STUDY
TECHNIQUE: Combined double and single contrast examination was performed using
effervescent crystals, high-density barium, and thin liquid barium.
This exam was performed by Garberg, Karlerik, and was supervised and
interpreted by Dr. Rtoyota Joshjax.
FLUOROSCOPY:
Radiation Exposure Index (as provided by the fluoroscopic device):
22 mGy Kerma

[Series 1: cp_standard · 0.34mm/px · 2 of 34 frames shown (1 of 9)]
[frame 1/34]
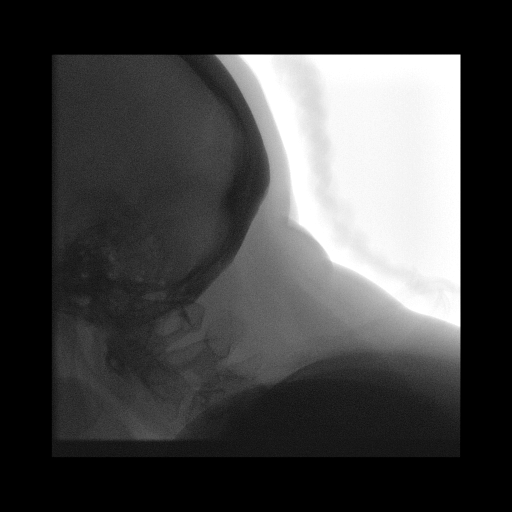
[frame 29/34]
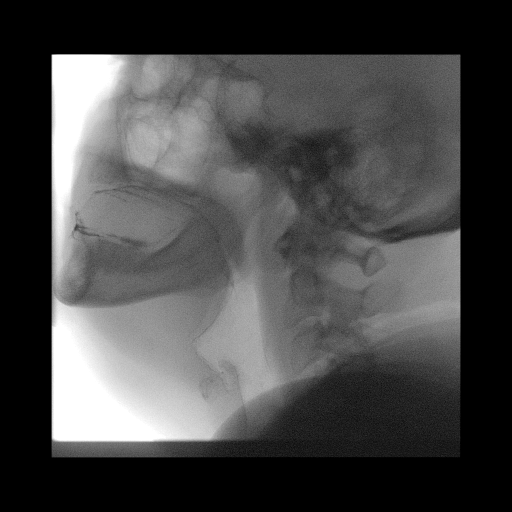

[Series 2: cp_standard · 0.51mm/px · 1 of 115 frames shown (2 of 9)]
[frame 98/115]
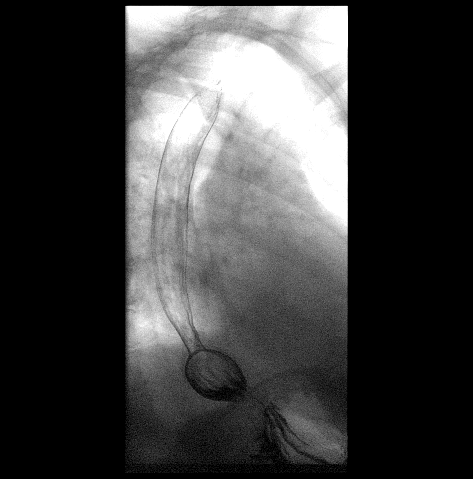

[Series 4: fluoro_barium 2fps_bw · 0.17mm/px · 1 of 2 frames shown]
[frame 1/2]
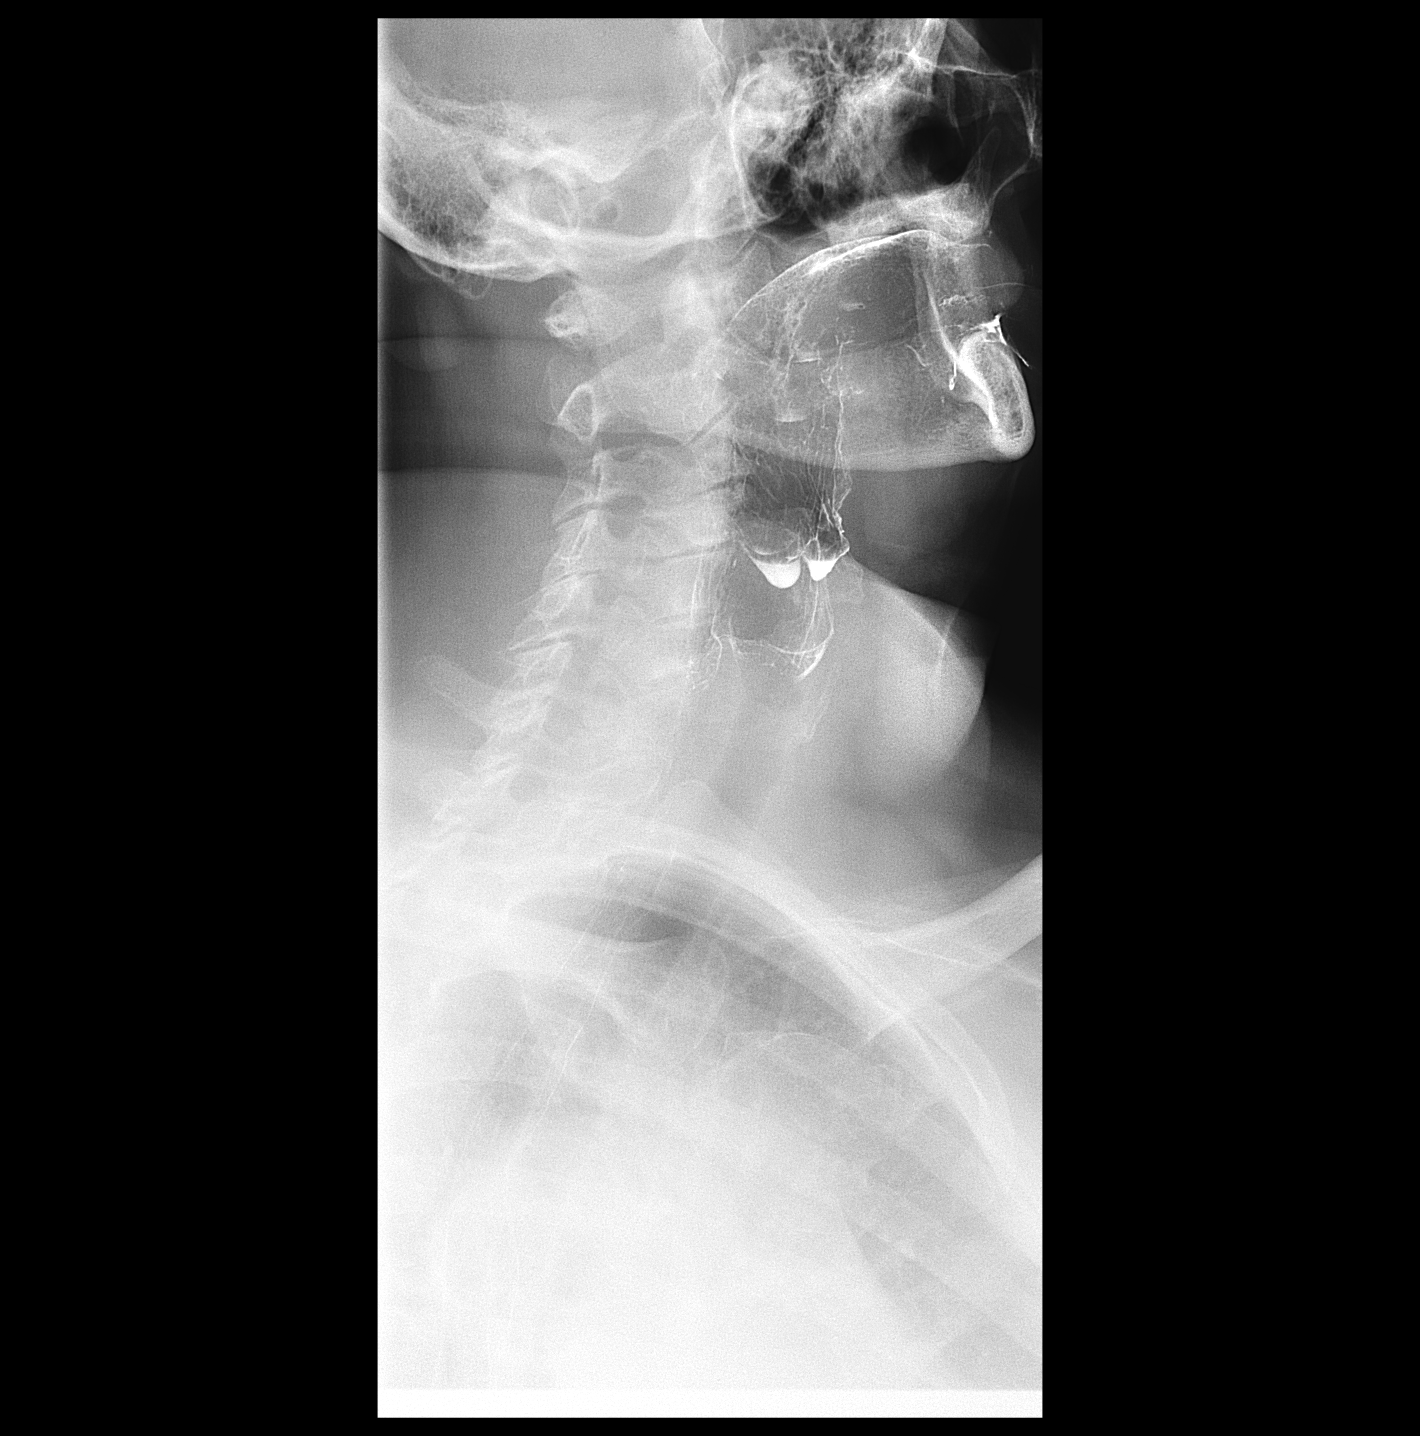

[Series 5: cp_standard · 0.51mm/px · 1 of 77 frames shown (3 of 9)]
[frame 12/77]
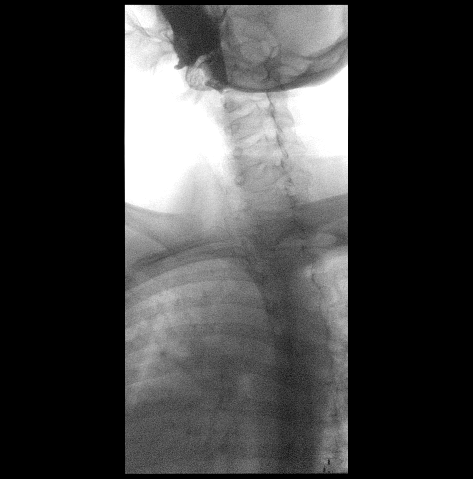

[Series 6: cp_standard · 0.51mm/px · 2 of 139 frames shown (4 of 9)]
[frame 21/139]
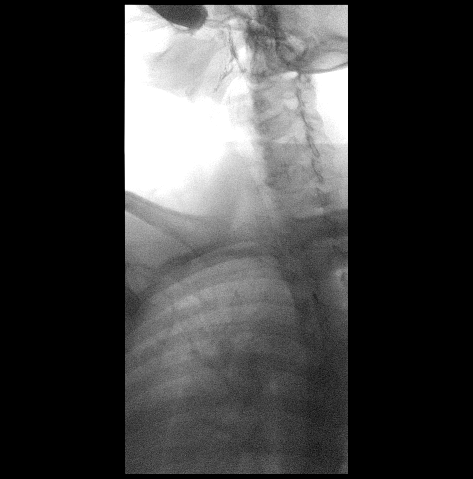
[frame 119/139]
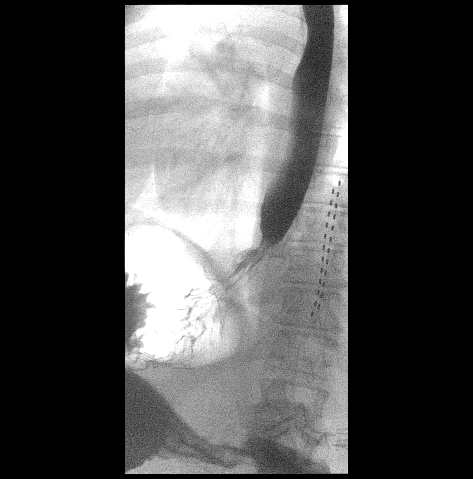

[Series 7: cp_standard · 0.51mm/px · 1 of 6 frames shown (5 of 9)]
[frame 4/6]
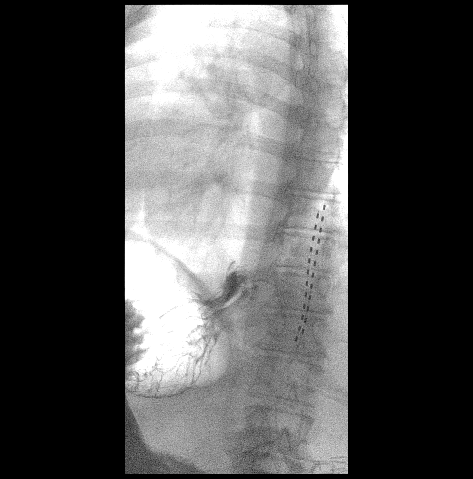

[Series 8: cp_standard · 0.52mm/px · 1 of 32 frames shown (6 of 9)]
[frame 5/32]
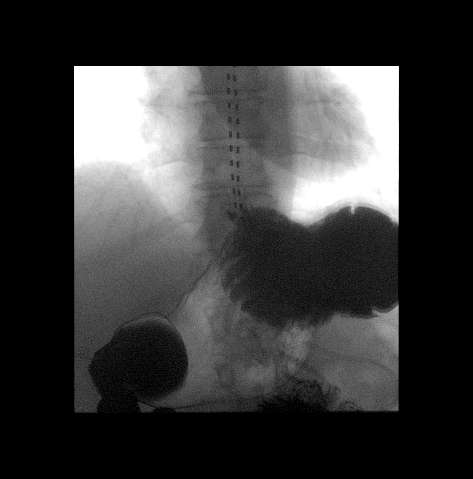

[Series 9: cp_standard · 0.52mm/px · 1 of 46 frames shown (7 of 9)]
[frame 13/46]
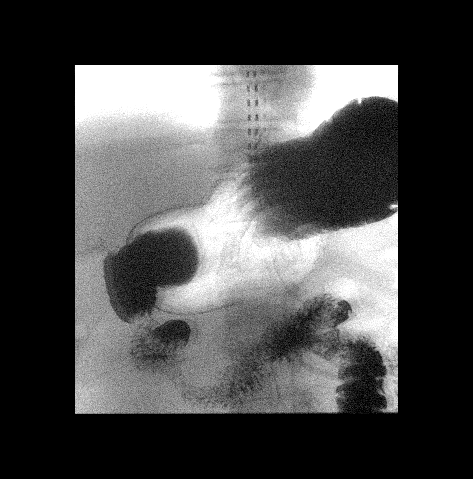

[Series 10: cp_standard · 0.54mm/px · 2 of 44 frames shown (8 of 9)]
[frame 4/44]
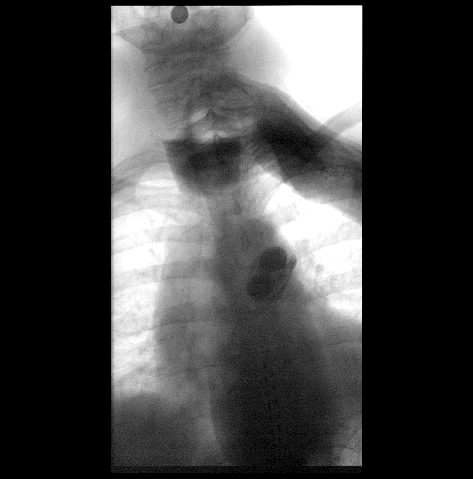
[frame 23/44]
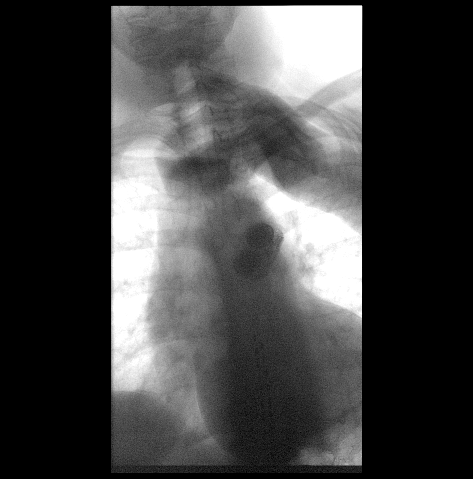

[Series 12: cp_standard · 0.51mm/px · 2 of 43 frames shown (9 of 9)]
[frame 4/43]
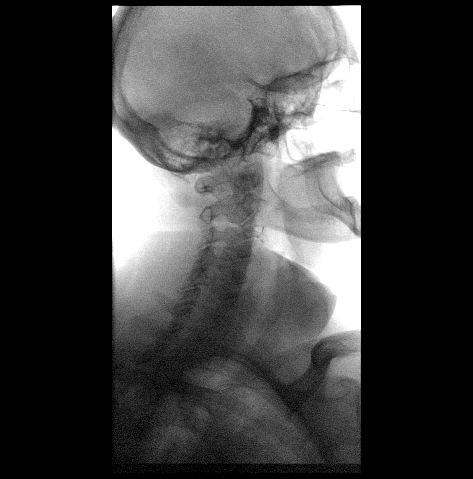
[frame 37/43]
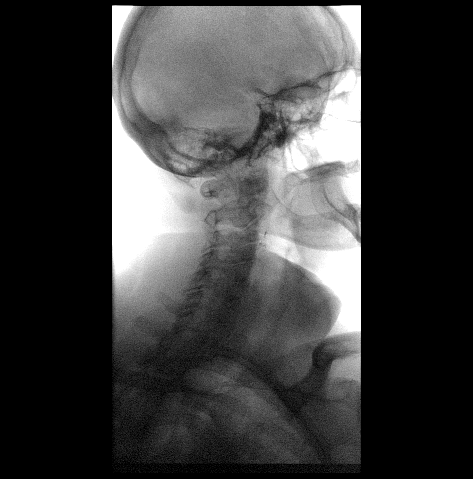

[14 of 24 positions shown; findings below may reference images not displayed]

FINDINGS: Swallowing: Appears normal. No vestibular penetration or aspiration
seen.

Pharynx: Unremarkable.

Esophagus: subtle area of shelf-like indentation along the
anterolateral esophagus seen in the prone RAO view at the expected
location of the B ring.

Esophageal motility: Intact primary wave with near complete
esophageal stripping.

Hiatal Hernia: Small sliding type hiatal hernia.

Gastroesophageal reflux: Reflux seen with cough but not with
Valsalva

Ingested 13mm barium tablet: Passed normally.

Other: None.
IMPRESSION: Small sliding type hiatal hernia. Small amount of gastroesophageal
reflux.

Subtle area of indentation along the anterior esophagus without
esophageal narrowing may represent partial Schatzki ring or
developing peptic stricture. In a patient with history of Barrett's
esophagus would correlate with recent endoscopy results or with
follow-up endoscopy as warranted.

## 2023-03-24 ENCOUNTER — Other Ambulatory Visit: Payer: Self-pay

## 2023-03-24 ENCOUNTER — Emergency Department (HOSPITAL_COMMUNITY)
Admission: EM | Admit: 2023-03-24 | Discharge: 2023-03-25 | Disposition: A | Discharge status: Home / Self Care | Payer: 59 | Attending: Emergency Medicine | Admitting: Emergency Medicine

## 2023-03-24 ENCOUNTER — Encounter (HOSPITAL_COMMUNITY): Payer: Self-pay

## 2023-03-24 DIAGNOSIS — R1012 Left upper quadrant pain: Secondary | ICD-10-CM | POA: Insufficient documentation

## 2023-03-24 DIAGNOSIS — I2510 Atherosclerotic heart disease of native coronary artery without angina pectoris: Secondary | ICD-10-CM | POA: Diagnosis not present

## 2023-03-24 DIAGNOSIS — Z79899 Other long term (current) drug therapy: Secondary | ICD-10-CM | POA: Diagnosis not present

## 2023-03-24 DIAGNOSIS — R531 Weakness: Secondary | ICD-10-CM | POA: Diagnosis not present

## 2023-03-24 DIAGNOSIS — J45909 Unspecified asthma, uncomplicated: Secondary | ICD-10-CM | POA: Insufficient documentation

## 2023-03-24 DIAGNOSIS — E1122 Type 2 diabetes mellitus with diabetic chronic kidney disease: Secondary | ICD-10-CM | POA: Insufficient documentation

## 2023-03-24 DIAGNOSIS — Z7982 Long term (current) use of aspirin: Secondary | ICD-10-CM | POA: Insufficient documentation

## 2023-03-24 DIAGNOSIS — R112 Nausea with vomiting, unspecified: Secondary | ICD-10-CM | POA: Diagnosis present

## 2023-03-24 DIAGNOSIS — I129 Hypertensive chronic kidney disease with stage 1 through stage 4 chronic kidney disease, or unspecified chronic kidney disease: Secondary | ICD-10-CM | POA: Diagnosis not present

## 2023-03-24 DIAGNOSIS — N183 Chronic kidney disease, stage 3 unspecified: Secondary | ICD-10-CM | POA: Diagnosis not present

## 2023-03-24 DIAGNOSIS — E1165 Type 2 diabetes mellitus with hyperglycemia: Secondary | ICD-10-CM | POA: Insufficient documentation

## 2023-03-24 LAB — I-STAT CHEM 8, ED
BUN: 13 mg/dL (ref 8–23)
Calcium, Ion: 1.18 mmol/L (ref 1.15–1.40)
Chloride: 102 mmol/L (ref 98–111)
Creatinine, Ser: 1.6 mg/dL — ABNORMAL HIGH (ref 0.44–1.00)
Glucose, Bld: 167 mg/dL — ABNORMAL HIGH (ref 70–99)
HCT: 44 % (ref 36.0–46.0)
Hemoglobin: 15 g/dL (ref 12.0–15.0)
Potassium: 4.1 mmol/L (ref 3.5–5.1)
Sodium: 139 mmol/L (ref 135–145)
TCO2: 26 mmol/L (ref 22–32)

## 2023-03-24 LAB — CBG MONITORING, ED: Glucose-Capillary: 168 mg/dL — ABNORMAL HIGH (ref 70–99)

## 2023-03-24 MED ORDER — SODIUM CHLORIDE 0.9 % IV BOLUS
1000.0000 mL | Freq: Once | INTRAVENOUS | Status: AC
  Administered 2023-03-24: 1000 mL via INTRAVENOUS

## 2023-03-24 MED ORDER — METOCLOPRAMIDE HCL 5 MG/ML IJ SOLN
10.0000 mg | Freq: Once | INTRAMUSCULAR | Status: AC
  Administered 2023-03-24: 10 mg via INTRAVENOUS
  Filled 2023-03-24: qty 2

## 2023-03-24 NOTE — ED Triage Notes (Signed)
 Pt BIBA from home, c/o sudden onset of dizziness and N/V. Pt also was lethargic. 4mg  of Zofran, 20 LAC. Denies pain.   BP 132/76 HR 80s CBG 136

## 2023-03-25 LAB — COMPREHENSIVE METABOLIC PANEL
ALT: 22 U/L (ref 0–44)
AST: 40 U/L (ref 15–41)
Albumin: 3.5 g/dL (ref 3.5–5.0)
Alkaline Phosphatase: 44 U/L (ref 38–126)
Anion gap: 11 (ref 5–15)
BUN: 14 mg/dL (ref 8–23)
CO2: 25 mmol/L (ref 22–32)
Calcium: 9.4 mg/dL (ref 8.9–10.3)
Chloride: 102 mmol/L (ref 98–111)
Creatinine, Ser: 1.53 mg/dL — ABNORMAL HIGH (ref 0.44–1.00)
GFR, Estimated: 38 mL/min — ABNORMAL LOW (ref >60)
Glucose, Bld: 171 mg/dL — ABNORMAL HIGH (ref 70–99)
Potassium: 4.4 mmol/L (ref 3.5–5.1)
Sodium: 138 mmol/L (ref 135–145)
Total Bilirubin: 0.9 mg/dL (ref 0.0–1.2)
Total Protein: 7.1 g/dL (ref 6.5–8.1)

## 2023-03-25 LAB — CBC WITH DIFFERENTIAL/PLATELET
Abs Immature Granulocytes: 0.03 10*3/uL (ref 0.00–0.07)
Basophils Absolute: 0.1 10*3/uL (ref 0.0–0.1)
Basophils Relative: 2 %
Eosinophils Absolute: 0.4 10*3/uL (ref 0.0–0.5)
Eosinophils Relative: 5 %
HCT: 42.9 % (ref 36.0–46.0)
Hemoglobin: 13.9 g/dL (ref 12.0–15.0)
Immature Granulocytes: 0 %
Lymphocytes Relative: 17 %
Lymphs Abs: 1.2 10*3/uL (ref 0.7–4.0)
MCH: 31.4 pg (ref 26.0–34.0)
MCHC: 32.4 g/dL (ref 30.0–36.0)
MCV: 97.1 fL (ref 80.0–100.0)
Monocytes Absolute: 0.3 10*3/uL (ref 0.1–1.0)
Monocytes Relative: 5 %
Neutro Abs: 5.1 10*3/uL (ref 1.7–7.7)
Neutrophils Relative %: 71 %
Platelets: 246 10*3/uL (ref 150–400)
RBC: 4.42 MIL/uL (ref 3.87–5.11)
RDW: 14.6 % (ref 11.5–15.5)
WBC: 7.1 10*3/uL (ref 4.0–10.5)
nRBC: 0 % (ref 0.0–0.2)

## 2023-03-25 LAB — URINALYSIS, W/ REFLEX TO CULTURE (INFECTION SUSPECTED)
Bilirubin Urine: NEGATIVE
Glucose, UA: NEGATIVE mg/dL
Hgb urine dipstick: NEGATIVE
Ketones, ur: NEGATIVE mg/dL
Leukocytes,Ua: NEGATIVE
Nitrite: NEGATIVE
Protein, ur: NEGATIVE mg/dL
Specific Gravity, Urine: 1.012 (ref 1.005–1.030)
pH: 7 (ref 5.0–8.0)

## 2023-03-25 LAB — BLOOD GAS, VENOUS
Acid-base deficit: 0.3 mmol/L (ref 0.0–2.0)
Bicarbonate: 27.4 mmol/L (ref 20.0–28.0)
O2 Saturation: 65.1 %
Patient temperature: 37
pCO2, Ven: 57 mm[Hg] (ref 44–60)
pH, Ven: 7.29 (ref 7.25–7.43)
pO2, Ven: 35 mm[Hg] (ref 32–45)

## 2023-03-25 LAB — LIPASE, BLOOD: Lipase: 32 U/L (ref 11–51)

## 2023-03-25 LAB — TROPONIN I (HIGH SENSITIVITY)
Troponin I (High Sensitivity): 2 ng/L (ref <18)
Troponin I (High Sensitivity): 2 ng/L (ref <18)

## 2023-03-25 MED ORDER — ONDANSETRON 4 MG PO TBDP: 4.0000 mg | ORAL_TABLET | Freq: Three times a day (TID) | ORAL | 0 refills | Status: AC | PRN

## 2023-03-25 MED ORDER — PROCHLORPERAZINE 25 MG RE SUPP: 25.0000 mg | Freq: Two times a day (BID) | RECTAL | 0 refills | Status: AC | PRN

## 2023-03-25 NOTE — ED Notes (Signed)
 Pt given sprite. Tolerating well

## 2023-03-25 NOTE — ED Provider Notes (Signed)
 [place] [facility] AT [facility] Provider Note  CSN: [id number] Arrival date & time: 03/24/23 2259  Chief Complaint(s) Weakness, Nausea, and Emesis  HPI Martha Nolan is a 68 y.o. female with a past medical history listed below who presents to the emergency department with several hours of nausea and nonbloody nonbilious emesis.  Patient reports having dinner 6 hours prior to symptom onset.  States that she ate some Lays chips and shortly after that symptoms began.  Patient reports feeling extremely weak generally   Since symptom onset.  She denied any associated chest pain.  Endorses left upper quadrant abdominal pain without emesis.  No previous or current pain at this time.  She denies any recent fevers or infections.  No cough or congestion.  No diarrhea.  No other physical complaints.  The history is provided by the patient.    Past Medical History Past Medical History:  Diagnosis Date   Anxiety    Ascending aorta dilatation (HCC)    38 cm by 2D echo 06/2021   Asthma    Barrett's esophagus    Dr. Nedra Hai (EGD - 03/2013) Q 3 years    CAD (coronary artery disease), native coronary artery    Coronary CTA showed high coronary Ca score of 519 with 25-49% proximal and 50-69% distal RCA lesions, <25% distal LM, 50-69% proximal LAD, 25-49% mid LAD and 70-99% in proximal D1, < 25% proximal LCx.  Normal FFR 07/2021   Chronic back pain    Chronic pain    Low Back L4-S1   Depression    Diabetes mellitus without complication (HCC)    GERD (gastroesophageal reflux disease)    Hypertension    Internal hemorrhoids    Dr. Conley Rolls ([place]   Migraine    Sleep apnea    Spinal stenosis    Patient Active Problem List   Diagnosis Date Noted   Palpitations 07/27/2021   CAD (coronary artery disease), native coronary artery 07/11/2021   Ascending aorta dilatation (HCC) 06/29/2021   AKI (acute kidney injury) (HCC) 03/22/2021   CKD (chronic kidney disease) stage 3, GFR 30-59 ml/min  (HCC) 03/22/2021   Chronic pain 03/22/2021   Tobacco abuse 03/22/2021   Preoperative clearance 10/05/2013   Toenail fungus 09/23/2013   Right Achilles tendinitis 09/07/2013   Right knee pain 08/10/2013   Anxiety state 07/26/2013   Impaired fasting glucose 07/26/2013   Swelling of limb 07/26/2013   DM2 (diabetes mellitus, type 2) (HCC) 07/26/2013   Knee pain, bilateral 07/26/2013   Other malaise and fatigue 07/26/2013   HLD (hyperlipidemia) 07/26/2013   Vitamin D deficiency 07/26/2013   Essential hypertension, benign 06/17/2013   GERD (gastroesophageal reflux disease) 06/17/2013   Migraine headache 06/17/2013   Home Medication(s) Prior to Admission medications   Medication Sig Start Date End Date Taking? Authorizing Provider  albuterol (PROVENTIL HFA;VENTOLIN HFA) 108 (90 BASE) MCG/ACT inhaler Inhale 2 puffs into the lungs every 6 (six) hours as needed for wheezing or shortness of breath.   Yes [provider]  allopurinol (ZYLOPRIM) 100 MG tablet Take 100 mg by mouth daily. 03/16/21  Yes [provider]  aspirin 81 MG chewable tablet Chew 81 mg by mouth at bedtime.   Yes [provider]  aspirin-acetaminophen-caffeine (EXCEDRIN MIGRAINE) [phone] MG tablet Take 1-2 tablets by mouth every 6 (six) hours as needed for headache.   Yes [provider]  azelastine (ASTELIN) 0.1 % nasal spray Place 2 sprays into both nostrils 2 (two) times daily  as needed for allergies. 03/15/21  Yes [provider]  B Complex-C (SUPER B COMPLEX PO) Take 1 tablet by mouth 2 (two) times a week.   Yes [provider]  buPROPion (WELLBUTRIN XL) 300 MG 24 hr tablet Take 300 mg by mouth daily. 12/25/20  Yes [provider]  cetirizine (ZYRTEC) 10 MG tablet Take 10 mg by mouth at bedtime.   Yes [provider]  Cholecalciferol (D3 5000) 125 MCG (5000 UT) capsule Take 5,000 Units by mouth every other day.   Yes [provider]  Choline  Fenofibrate (FENOFIBRIC ACID) 135 MG CPDR Take 135 mg by mouth daily. 03/16/21  Yes [provider]  Chromium 1000 MCG TABS Take 1,000 mcg by mouth at bedtime.   Yes [provider]  CRANBERRY PO Take 1 tablet by mouth 2 (two) times daily.   Yes [provider]  docusate sodium (COLACE) 100 MG capsule Take 100 mg by mouth at bedtime.   Yes [provider]  DULoxetine (CYMBALTA) 60 MG capsule Take 60 mg by mouth daily.   Yes [provider]  famotidine (PEPCID) 40 MG tablet Take 40 mg by mouth at bedtime. 03/15/21  Yes [provider]  ferrous sulfate 325 (65 FE) MG tablet Take 325 mg by mouth See admin instructions. 325mg  daily on Tuesday Thursday Saturday Sunday   Yes [provider]  Folic Acid (FOLATE PO) Take 1 mg by mouth daily.   Yes [provider]  Garlic 1000 MG CAPS Take 1,000 mg by mouth 2 (two) times daily.   Yes [provider]  Ginger, Zingiber officinalis, (GINGER ROOT) 550 MG CAPS Take 550 mg by mouth daily.   Yes [provider]  ipratropium-albuterol (DUONEB) 0.5-2.5 (3) MG/3ML SOLN Take 3 mLs by nebulization every 6 (six) hours as needed. 09/03/22  Yes [provider]  metoprolol succinate (TOPROL-XL) 25 MG 24 hr tablet Take 1 tablet (25 mg total) by mouth daily. 07/12/21  Yes Turner, Cornelious Bryant, MD  Multiple Vitamins-Minerals (MULTIVITAMIN WITH MINERALS) tablet Take 1 tablet by mouth 2 (two) times a week.   Yes [provider]  nystatin (MYCOSTATIN/NYSTOP) powder Apply 1 application topically 4 (four) times daily as needed (yeast rash).   Yes [provider]  ondansetron (ZOFRAN-ODT) 4 MG disintegrating tablet Take 1 tablet (4 mg total) by mouth every 8 (eight) hours as needed for up to 3 days for nausea or vomiting. 03/25/23 03/28/23 Yes Shepard Keltz, Amadeo Garnet, MD  pantoprazole (PROTONIX) 40 MG tablet Take 40 mg by mouth 2 (two) times daily. 02/06/21  Yes [provider]   pregabalin (LYRICA) 200 MG capsule Take 200 mg by mouth in the morning, at noon, and at bedtime.   Yes [provider]  prochlorperazine (COMPAZINE) 25 MG suppository Place 1 suppository (25 mg total) rectally every 12 (twelve) hours as needed for nausea or vomiting. 03/25/23  Yes Jaremy Nosal, Amadeo Garnet, MD  rosuvastatin (CRESTOR) 20 MG tablet Take 20 mg by mouth at bedtime. 02/27/21  Yes [provider]  Semaglutide (OZEMPIC, 0.25 OR 0.5 MG/DOSE, [place]) Inject 0.5 mg into the skin once a week.   Yes [provider]  Turmeric 500 MG CAPS Take 500 mg by mouth daily.   Yes [provider]  potassium chloride SA (K-DUR,KLOR-CON) 20 MEQ tablet Take 1 tablet (20 mEq total) by mouth 2 (two) times daily. 06/17/13 08/25/21  Gillian Scarce, MD  Allergies Penicillins  Review of Systems Review of Systems As noted in HPI  Physical Exam Vital Signs  I have reviewed the triage vital signs BP 135/71   Pulse 62   Temp 98.7 F (37.1 C) (Oral)   Resp 14   Ht 5\' 5"  (1.651 m)   Wt 115.7 kg   SpO2 97%   BMI 42.43 kg/m   Physical Exam Vitals reviewed.  Constitutional:      General: She is not in acute distress.    Appearance: She is well-developed. She is obese. She is not diaphoretic.  HENT:     Head: Normocephalic and atraumatic.     Nose: Nose normal.  Eyes:     General: No scleral icterus.       Right eye: No discharge.        Left eye: No discharge.     Conjunctiva/sclera: Conjunctivae normal.     Pupils: Pupils are equal, round, and reactive to light.  Cardiovascular:     Rate and Rhythm: Normal rate and regular rhythm.     Heart sounds: No murmur heard.    No friction rub. No gallop.  Pulmonary:     Effort: Pulmonary effort is normal. No respiratory distress.     Breath sounds: Normal breath sounds. No stridor. No rales.   Abdominal:     General: There is no distension.     Palpations: Abdomen is soft.     Tenderness: There is no abdominal tenderness. There is no guarding or rebound.  Musculoskeletal:        General: No tenderness.     Cervical back: Normal range of motion and neck supple.  Skin:    General: Skin is warm and dry.     Findings: No erythema or rash.  Neurological:     Mental Status: She is alert and oriented to person, place, and time.     ED Results and Treatments Labs (all labs ordered are listed, but only abnormal results are displayed) Labs Reviewed  COMPREHENSIVE METABOLIC PANEL - Abnormal; Notable for the following components:      Result Value   Glucose, Bld 171 (*)    Creatinine, Ser 1.53 (*)    GFR, Estimated 38 (*)    All other components within normal limits  URINALYSIS, W/ REFLEX TO CULTURE (INFECTION SUSPECTED) - Abnormal; Notable for the following components:   Bacteria, UA RARE (*)    All other components within normal limits  CBG MONITORING, ED - Abnormal; Notable for the following components:   Glucose-Capillary 168 (*)    All other components within normal limits  I-STAT CHEM 8, ED - Abnormal; Notable for the following components:   Creatinine, Ser 1.60 (*)    Glucose, Bld 167 (*)    All other components within normal limits  LIPASE, BLOOD  CBC WITH DIFFERENTIAL/PLATELET  BLOOD GAS, VENOUS  TROPONIN I (HIGH SENSITIVITY)  TROPONIN I (HIGH SENSITIVITY)  EKG  EKG Interpretation Date/Time:  Sunday March 24 2023 23:18:37 EST Ventricular Rate:  73 PR Interval:  177 QRS Duration:  99 QT Interval:  393 QTC Calculation: 433 R Axis:   -21  Text Interpretation: Sinus rhythm Borderline left axis deviation Low voltage, precordial leads No significant change was found Confirmed by Yahshua Thibault ([id number]) on 03/24/2023 11:48:34 PM        Radiology No results found.  Medications Ordered in ED Medications  sodium chloride 0.9 % bolus 1,000 mL (0 mLs Intravenous Stopped 03/25/23 0257)  metoCLOPramide (REGLAN) injection 10 mg (10 mg Intravenous Given 03/24/23 2344)   Procedures Procedures  (including critical care time) Medical Decision Making / ED Course   Medical Decision Making Amount and/or Complexity of Data Reviewed Labs: ordered. Decision-making details documented in ED Course. ECG/medicine tests: ordered and independent interpretation performed. Decision-making details documented in ED Course.  Risk Prescription drug management. Decision regarding hospitalization.     Clinical Course as of 03/25/23 2319  Sun Mar 24, 2023  2330 Nausea vomiting.  Differential diagnosis considered and workup below  Will assess for electrolyte/metabolic derangements including complication from diabetes.  Also considering gastroparesis versus exacerbation of GERD from sliding hiatal hernia.  Will also assess for atypical ACS given her history of diabetes.  Abdomen is benign thus I have a low suspicion for serious intra-abdominal inflammatory/infectious process at this time.  Will defer imaging for the time being [PC]    Clinical Course User Index [PC] Driana Dazey Eduardo, MD   Labs grossly reassuring without leukocytosis or anemia, no significant electrolyte derangements, mild hyperglycemia without DKA, mild renal insufficiency without AKI, no evidence of bili obstruction or pancreatitis.  UA without evidence of infection.  EKG without acute ischemic changes or evidence of pericarditis.  Serial troponins negative x 2.  Patient was provided with IV fluids and single dose of antiemetics.  Reports feeling significantly better and able to tolerate p.o. after several hours of monitoring and repeat reassessments.  Low suspicion for serious intra-abdominal inflammatory/infectious process or bowel obstruction  requiring  imaging at this time.   Final Clinical Impression(s) / ED Diagnoses Final diagnoses:  Nausea and vomiting in adult   The patient appears reasonably screened and/or stabilized for discharge and I doubt any other medical condition or other EMC requiring further screening, evaluation, or treatment in the ED at this time. I have discussed the findings, Dx and Tx plan with the patient/family who expressed understanding and agree(s) with the plan. Discharge instructions discussed at length. The patient/family was given strict return precautions who verbalized understanding of the instructions. No further questions at time of discharge.  Disposition: Discharge  Condition: Good  ED Discharge Orders          Ordered    prochlorperazine (COMPAZINE) 25 MG suppository  Every 12 hours PRN        03/25/23 0723    ondansetron (ZOFRAN-ODT) 4 MG disintegrating tablet  Every 8 hours PRN        02 /17/25 0723             Follow Up: Hillery Aldo, NP [address] [place] [address] [phone]  Call  to schedule an appointment for close follow up    This chart was dictated using voice recognition software.  Despite best efforts to proofread,  errors can occur which can change the documentation meaning.    Nira Conn, MD 03/25/23 [phone]

## 2023-03-25 NOTE — ED Notes (Signed)
 O2 sat dropped to 88, placed pt on 2L brought her up to 100

## 2023-07-19 ENCOUNTER — Encounter: Payer: Self-pay | Admitting: Podiatry

## 2023-07-19 ENCOUNTER — Ambulatory Visit (INDEPENDENT_AMBULATORY_CARE_PROVIDER_SITE_OTHER): Admitting: Podiatry

## 2023-07-19 DIAGNOSIS — G5793 Unspecified mononeuropathy of bilateral lower limbs: Secondary | ICD-10-CM | POA: Diagnosis not present

## 2023-07-19 DIAGNOSIS — E1149 Type 2 diabetes mellitus with other diabetic neurological complication: Secondary | ICD-10-CM

## 2023-07-19 NOTE — Progress Notes (Signed)
 Complaint of numbness in the great toes and medial aspect of the foot bilaterally.  Generally gets some sharp prickly sensations.  She is already on the Lyrica  200 mg p.o. 3 times daily.  Is interested in getting some new diabetic shoes and insoles.  Diabetes is well-controlled.   Physical exam:  General appearance: Pleasant, and in no acute distress. AOx3.  Vascular: Pedal pulses: DP 2/4 bilaterally, PT 1/4 bilaterally.  Moderate edema lower legs bilaterally. Capillary fill time immediate.  Neurological: Decreased vibratory sensation feet bilaterally.  Decreased monofilament sensation forefoot bilaterally.  Achilles tendon reflex increased bilaterally.  Dermatologic:   Skin normal temperature bilaterally.  And thin and atrophic.  No hair growth lower extremity bilaterally.  Areas of hyperpigmentation feet bilaterally  Musculoskeletal: Mild hallux abductovalgus deformities bilaterally.  No tenderness to palpation in the forefoot.  Normal range of motion ankle joint and subtalar joint.  Normal muscle strength in foot and ankle bilaterally    Diagnosis: 1.  Neuritis bilaterally feet 2.  Diabetes mellitus type 2 with diabetic neuropathy.  Plan: -New patient visit level 3 for evaluation and management. -She also the neuropathy.  She is already taking Lyrica  200 mg p.o. 3 times daily.  She should continue with this.  Would recommend trying some topical creams for this.  Recommended 1% lidocaine cream and/or capsaicin cream.  She can use it several times a day.  Will also get her set up with a set of diabetic shoes and insoles we can give her an Rx for these -Rx diabetic shoes and custom insoles bilaterally, dispense 1 pair.  Patient has pedal deformities of hallux abductovalgus deformity and neuropathy.  With type 2 diabetes  Return prn

## 2023-11-02 ENCOUNTER — Other Ambulatory Visit (HOSPITAL_BASED_OUTPATIENT_CLINIC_OR_DEPARTMENT_OTHER): Payer: Self-pay

## 2023-11-02 DIAGNOSIS — M48062 Spinal stenosis, lumbar region with neurogenic claudication: Secondary | ICD-10-CM

## 2023-11-20 ENCOUNTER — Ambulatory Visit (INDEPENDENT_AMBULATORY_CARE_PROVIDER_SITE_OTHER): Admitting: Physician Assistant

## 2023-11-20 ENCOUNTER — Encounter: Payer: Self-pay | Admitting: Physician Assistant

## 2023-11-20 ENCOUNTER — Ambulatory Visit: Admitting: Physician Assistant

## 2023-11-20 VITALS — BP 100/69

## 2023-11-20 DIAGNOSIS — D485 Neoplasm of uncertain behavior of skin: Secondary | ICD-10-CM

## 2023-11-20 DIAGNOSIS — C4492 Squamous cell carcinoma of skin, unspecified: Secondary | ICD-10-CM

## 2023-11-20 DIAGNOSIS — C44622 Squamous cell carcinoma of skin of right upper limb, including shoulder: Secondary | ICD-10-CM | POA: Diagnosis not present

## 2023-11-20 HISTORY — DX: Squamous cell carcinoma of skin, unspecified: C44.92

## 2023-11-20 NOTE — Progress Notes (Signed)
   New Patient Visit   Subjective  Martha Nolan is a 68 y.o. female NEW PATIENT who presents for the following: Spot of right hand that has been there a couple of months and has gotten larger. It is very painful, especially if hit.  No prior history of skin cancer.     The following portions of the chart were reviewed this encounter and updated as appropriate: medications, allergies, medical history  Review of Systems:  No other skin or systemic complaints except as noted in HPI or Assessment and Plan.  Objective  Well appearing patient in no apparent distress; mood and affect are within normal limits.   A focused examination was performed of the following areas: Left hand   Relevant exam findings are noted in the Assessment and Plan.  Right Dorsal Hand 2.2 cm hyperkeratotic nodule   Assessment & Plan     NEOPLASM OF UNCERTAIN BEHAVIOR OF SKIN Right Dorsal Hand Skin / nail biopsy Type of biopsy: tangential   Informed consent: discussed and consent obtained   Timeout: patient name, date of birth, surgical site, and procedure verified   Procedure prep:  Patient was prepped and draped in usual sterile fashion Prep type:  Isopropyl alcohol Anesthesia: the lesion was anesthetized in a standard fashion   Anesthetic:  1% lidocaine w/ epinephrine  1-100,000 buffered w/ 8.4% NaHCO3 Instrument used: flexible razor blade   Hemostasis achieved with: pressure, aluminum chloride and electrodesiccation   Outcome: patient tolerated procedure well   Post-procedure details: sterile dressing applied and wound care instructions given   Dressing type: bandage and petrolatum    Specimen 1 - Surgical pathology Differential Diagnosis: SCC vs other   Check Margins: No  Return for pending biopsy results.  I, Roseline Hutchinson, CMA, am acting as scribe for Crespin Forstrom K, PA-C .   Documentation: I have reviewed the above documentation for accuracy and completeness, and I agree with the  above.  Ilian Wessell K, PA-C

## 2023-11-20 NOTE — Patient Instructions (Signed)

## 2023-11-21 LAB — SURGICAL PATHOLOGY

## 2023-11-22 ENCOUNTER — Ambulatory Visit: Payer: Self-pay | Admitting: Physician Assistant

## 2023-12-17 ENCOUNTER — Ambulatory Visit: Admitting: Dermatology

## 2023-12-17 NOTE — Progress Notes (Deleted)
   Follow-Up Visit   Subjective  Martha Nolan is a 68 y.o. female who presents for the following: Mohs right dorsal hand  The following portions of the chart were reviewed this encounter and updated as appropriate: medications, allergies, medical history  Review of Systems:  No other skin or systemic complaints except as noted in HPI or Assessment and Plan.  Objective  Well appearing patient in no apparent distress; mood and affect are within normal limits.  A focused examination was performed of the following areas: Right dorsal hand Relevant physical exam findings are noted in the Assessment and Plan.     Assessment & Plan      No follow-ups on file.  ***  Documentation: I have reviewed the above documentation for accuracy and completeness, and I agree with the above.  RUFUS CHRISTELLA HOLY, MD

## 2023-12-18 ENCOUNTER — Ambulatory Visit (INDEPENDENT_AMBULATORY_CARE_PROVIDER_SITE_OTHER): Admitting: Dermatology

## 2023-12-18 ENCOUNTER — Encounter: Payer: Self-pay | Admitting: Dermatology

## 2023-12-18 VITALS — BP 99/67 | HR 100 | Temp 98.0°F

## 2023-12-18 DIAGNOSIS — L814 Other melanin hyperpigmentation: Secondary | ICD-10-CM | POA: Diagnosis not present

## 2023-12-18 DIAGNOSIS — L578 Other skin changes due to chronic exposure to nonionizing radiation: Secondary | ICD-10-CM | POA: Diagnosis not present

## 2023-12-18 DIAGNOSIS — C44622 Squamous cell carcinoma of skin of right upper limb, including shoulder: Secondary | ICD-10-CM | POA: Diagnosis not present

## 2023-12-18 DIAGNOSIS — C4492 Squamous cell carcinoma of skin, unspecified: Secondary | ICD-10-CM

## 2023-12-18 MED ORDER — MUPIROCIN 2 % EX OINT: 1.0000 | TOPICAL_OINTMENT | Freq: Two times a day (BID) | CUTANEOUS | 2 refills | Status: AC

## 2023-12-18 NOTE — Progress Notes (Signed)
 Follow-Up Visit   Subjective  Martha Nolan is a 68 y.o. 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  The following portions of the chart were reviewed this encounter and updated as appropriate: medications, allergies, medical history  Review of Systems:  No other skin or systemic complaints except as noted in HPI or Assessment and Plan.  Objective  Well appearing patient in no apparent distress; mood and affect are within normal limits.  A focused examination was performed of the following areas: Right dorsal hand Relevant physical exam findings are noted in the Assessment and Plan.   Right Dorsal Hand Ulcerated plaque   Assessment & Plan   SQUAMOUS CELL CARCINOMA OF SKIN Right Dorsal Hand Mohs surgery  Consent obtained: written  Anticoagulation: Was the anticoagulation regimen changed prior to Mohs? No    Anesthesia: Anesthesia method: local infiltration Local anesthetic: lidocaine 1% WITH epi  Procedure Details: Timeout: pre-procedure verification complete Procedure Prep: patient was prepped and draped in usual sterile fashion Prep type: chlorhexidine Biopsy accession number: [id number] Pre-Op diagnosis: squamous cell carcinoma SCC subtype: KA type MohsAIQ Surgical site (if tumor spans multiple areas, please select predominant area): hand Surgery side: right Surgical site (from skin exam): Right Dorsal Hand Pre-operative length (cm): 2.2 Pre-operative width (cm): 1.3 Indications for Mohs surgery: anatomic location where tissue conservation is critical  Micrographic Surgery Details: Post-operative length (cm): 3.4 Post-operative width (cm): 2 Number of Mohs stages: 2 Post surgery depth of defect: tendon  Stage 1    Tumor features identified on Mohs section: squamous cell carcinoma    Depth of tumor invasion after stage: dermis and subcutaneous  fat  Stage 2    Tumor features identified on Mohs section: no tumor identified  Reconstruction: Was the defect reconstructed? Yes   Was reconstruction performed by the same Mohs surgeon? Yes   Setting of reconstruction: outpatient office When was reconstruction performed? same day Type of reconstruction: linear Linear reconstruction: complex  Skin repair Complexity:  Complex Final length (cm):  6.5 Informed consent: discussed and consent obtained   Timeout: patient name, date of birth, surgical site, and procedure verified   Procedure prep:  Patient was prepped and draped in usual sterile fashion Prep type:  Chlorhexidine Anesthesia: the lesion was anesthetized in a standard fashion   Anesthetic:  1% lidocaine w/ epinephrine  1-100,000 buffered w/ 8.4% NaHCO3 Reason for type of repair: reduce tension to allow closure, preserve normal anatomy, preserve normal anatomical and functional relationships and avoid adjacent structures   Undermining: area extensively undermined   Subcutaneous layers (deep stitches):  Suture size:  4-0 Suture type: Vicryl (polyglactin 910)   Stitches:  Buried vertical mattress Fine/surface layer approximation (top stitches):  Suture size:  4-0 Suture type: Prolene (polypropylene)   Stitches: simple running   Hemostasis achieved with: suture, pressure and electrodesiccation Outcome: patient tolerated procedure well with no complications   Post-procedure details: sterile dressing applied and wound care instructions given   Dressing type: bandage and pressure dressing      Return in about 4 weeks (around 01/15/2024) for wound check, 10 day wound check/suture removal.  I, Darice Smock, CMA, am acting as scribe for RUFUS CHRISTELLA HOLY, MD.    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 They note  that the  area has been present for about 2 months increasing in size with time.  There is no history of previous treatment.  Reports no other new or changing lesions and has no other complaints today.  Medications and allergies: see patient chart.  Review of systems: Reviewed 8 systems and notable for the above skin cancer.  All other systems reviewed are unremarkable/negative, unless noted in the HPI. Past medical history, surgical history, family history, social history were also reviewed and are noted in the chart/questionnaire.    PHYSICAL EXAMINATION  General: Well-appearing, in no acute distress, alert and oriented x 4. Vitals reviewed in chart (if available).   Skin: Exam reveals a 2.2 x 1.3 cm erythematous papule and biopsy scar on the right dorsal hand. There are rhytids, telangiectasias, and lentigines, consistent with photodamage.  Biopsy report(s) reviewed, confirming the diagnosis.   ASSESSMENT  1) Squamous Cell Carcinoma-KA type- right dorsal hand 2) photodamage 3) solar lentigines   PLAN   1. Due to location, size, histology, or recurrence and the likelihood of subclinical extension as well as the need to conserve normal surrounding tissue, the patient was deemed acceptable for Mohs micrographic surgery (MMS).  The nature and purpose of the procedure, associated benefits and risks including recurrence and scarring, possible complications such as pain, infection, and bleeding, and alternative methods of treatment if appropriate were discussed with the patient during consent. The lesion location was verified by the patient, by reviewing previous notes, pathology reports, and by photographs as well as angulation measurements if available.  Informed consent was reviewed and signed by the patient, and timeout was performed at 9:30 AM. See op note below.  2. For the photodamage and solar lentigines, sun protection discussed/information given on OTC sunscreens, and we recommend continued  regular follow-up with primary dermatologist every 6 months or sooner for any growing, bleeding, or changing lesions. 3. Prognosis and future surveillance discussed. 4. Letter with treatment outcome sent to referring provider. 5. Pain acetaminophen /ibuprofen  MOHS MICROGRAPHIC SURGERY AND RECONSTRUCTION  Initial size:   2.2 x 1.3 cm Surgical defect/wound size: 3.4 x 2.0 cm Anesthesia:    0.33% lidocaine with 1:200,000 epinephrine  EBL:    <5 mL Complications:  None Repair type:   Complex SQ suture:   4-0 Vicryl Cutaneous suture:  5-0 Polyprolene Final size of the repair: 6.5 cm  Stages: 2  STAGE I: Anesthesia achieved with 0.5% lidocaine with 1:200,000 epinephrine . ChloraPrep applied. 1 section(s) excised using Mohs technique (this includes total peripheral and deep tissue margin excision and evaluation with frozen sections, excised and interpreted by the same physician). The tumor was first debulked and then excised with an approx. 2 mm margin.  Hemostasis was achieved with electrocautery as needed.  The specimen was then oriented, subdivided/relaxed, inked, and processed using Mohs technique.    Frozen section analysis revealed a positive margin for atypical epithelial cells with squamous differentiation in the dermis in the deep margin.    STAGE II: An additional 2 mm margin was excised.  Hemostasis was achieved with electrocautery as needed.  The specimen was then oriented, subdivided/relaxed, inked, and processed using Mohs technique. Evaluation of slides by the Mohs surgeon revealed clear tumor margins.  Reconstruction  The surgical wound was then cleaned, prepped, and re-anesthetized as above. Wound edges were undermined extensively along at least one entire edge and at a distance equal to or greater than the width of the defect (see wound defect size above) in order to achieve closure  and decrease wound tension and anatomic distortion. Redundant tissue repair including standing cone  removal was performed. Hemostasis was achieved with electrocautery. Subcutaneous and epidermal tissues were approximated with the above sutures. The surgical site was then lightly scrubbed with sterile, saline-soaked gauze. The area was then bandaged using Vaseline ointment, non-adherent gauze, gauze pads, and tape to provide an adequate pressure dressing. The patient tolerated the procedure well, was given detailed written and verbal wound care instructions, and was discharged in good condition.   The patient will follow-up: 10 days for suture removal.    Documentation: I have reviewed the above documentation for accuracy and completeness, and I agree with the above.  RUFUS CHRISTELLA HOLY, MD

## 2023-12-18 NOTE — Patient Instructions (Signed)

## 2023-12-30 ENCOUNTER — Ambulatory Visit: Admitting: Dermatology

## 2023-12-30 ENCOUNTER — Encounter: Payer: Self-pay | Admitting: Dermatology

## 2023-12-30 DIAGNOSIS — C4492 Squamous cell carcinoma of skin, unspecified: Secondary | ICD-10-CM

## 2023-12-30 DIAGNOSIS — T1490XD Injury, unspecified, subsequent encounter: Secondary | ICD-10-CM

## 2023-12-30 DIAGNOSIS — Z5189 Encounter for other specified aftercare: Secondary | ICD-10-CM

## 2023-12-30 NOTE — Progress Notes (Signed)
   Follow Up Visit   Subjective  Martha Nolan is a 68 y.o. female who presents for the following: follow up from Mohs surgery   The patient presents for follow up from Mohs surgery for a SCC-KA type on the right hand, treated on 12/18/2023, repaired with linear closure. The patient has been bandaging the wound as directed. The endorse the following concerns: none  The following portions of the chart were reviewed this encounter and updated as appropriate: medications, allergies, medical history  Review of Systems:  No other skin or systemic complaints except as noted in HPI or Assessment and Plan.  Objective  Well appearing patient in no apparent distress; mood and affect are within normal limits.  A focal examination was performed including hand Healing wound with mild erythema     Relevant physical exam findings are noted in the Assessment and Plan.    Assessment & Plan   Healing Wound s/p Mohs for SCC on the right hand, treated on 12/18/2023, repaired with linear closure - Reassured that wound is healing well - Sutures removed today - No evidence of infection - No swelling, induration, purulence, dehiscence, or tenderness out of proportion to the clinical exam, see photo above - Discussed that scars take up to 12 months to mature from the date of surgery - Recommend SPF 30+ to scar daily to prevent purple color from UV exposure during scar maturation process - Discussed that erythema and raised appearance of scar will fade over the next 4-6 months - OK to start scar massage at 4-6 weeks post-op - Can consider silicone based products for scar healing starting at 6 weeks post-op - Ok to continue ointment daily to wound under a bandage for another week  HISTORY OF SQUAMOUS CELL CARCINOMA OF THE SKIN - No evidence of recurrence today - Recommend regular full body skin exams - Recommend daily broad spectrum sunscreen SPF 30+ to sun-exposed areas, reapply every 2 hours as  needed.  - Call if any new or changing lesions are noted between office visits  Return in about 3 weeks (around 01/20/2024) for Mohs follow up.  Documentation: I have reviewed the above documentation for accuracy and completeness, and I agree with the above.  RUFUS CHRISTELLA HOLY, MD

## 2024-01-07 ENCOUNTER — Encounter: Admitting: Dermatology

## 2024-01-15 ENCOUNTER — Encounter: Payer: Self-pay | Admitting: Dermatology

## 2024-01-15 ENCOUNTER — Ambulatory Visit: Admitting: Dermatology
